# Patient Record
Sex: Female | Born: 1950 | Race: White | Hispanic: No | Smoking: Former smoker
Health system: Southern US, Community
[De-identification: ages and names within clinical notes are randomized; demographics above are authoritative.]

## PROBLEM LIST (undated history)

## (undated) DIAGNOSIS — I509 Heart failure, unspecified: Secondary | ICD-10-CM

## (undated) DIAGNOSIS — D649 Anemia, unspecified: Secondary | ICD-10-CM

## (undated) DIAGNOSIS — I2510 Atherosclerotic heart disease of native coronary artery without angina pectoris: Secondary | ICD-10-CM

## (undated) DIAGNOSIS — I639 Cerebral infarction, unspecified: Secondary | ICD-10-CM

## (undated) DIAGNOSIS — I10 Essential (primary) hypertension: Secondary | ICD-10-CM

## (undated) DIAGNOSIS — N186 End stage renal disease: Secondary | ICD-10-CM

## (undated) DIAGNOSIS — J449 Chronic obstructive pulmonary disease, unspecified: Secondary | ICD-10-CM

## (undated) DIAGNOSIS — N289 Disorder of kidney and ureter, unspecified: Secondary | ICD-10-CM

## (undated) DIAGNOSIS — Z992 Dependence on renal dialysis: Secondary | ICD-10-CM

## (undated) DIAGNOSIS — J189 Pneumonia, unspecified organism: Secondary | ICD-10-CM

## (undated) DIAGNOSIS — I219 Acute myocardial infarction, unspecified: Secondary | ICD-10-CM

## (undated) DIAGNOSIS — H547 Unspecified visual loss: Secondary | ICD-10-CM

## (undated) DIAGNOSIS — M1990 Unspecified osteoarthritis, unspecified site: Secondary | ICD-10-CM

---

## 1985-06-24 HISTORY — PX: TUBAL LIGATION: SHX77

## 2015-06-25 HISTORY — PX: INSERTION OF DIALYSIS CATHETER: SHX1324

## 2015-07-03 DIAGNOSIS — N250 Renal osteodystrophy: Secondary | ICD-10-CM | POA: Diagnosis present

## 2016-03-14 ENCOUNTER — Other Ambulatory Visit: Payer: Self-pay | Admitting: Surgery

## 2016-03-14 DIAGNOSIS — N186 End stage renal disease: Secondary | ICD-10-CM

## 2016-03-14 DIAGNOSIS — Z01810 Encounter for preprocedural cardiovascular examination: Secondary | ICD-10-CM

## 2016-04-26 ENCOUNTER — Encounter: Payer: Self-pay | Admitting: Surgery

## 2016-04-29 ENCOUNTER — Inpatient Hospital Stay (HOSPITAL_COMMUNITY): Admission: RE | Admit: 2016-04-29 | Payer: Self-pay | Source: Ambulatory Visit

## 2016-04-29 ENCOUNTER — Ambulatory Visit: Payer: Self-pay | Admitting: Surgery

## 2016-04-29 ENCOUNTER — Encounter (HOSPITAL_COMMUNITY): Payer: Self-pay

## 2016-05-02 ENCOUNTER — Encounter (HOSPITAL_COMMUNITY): Payer: Self-pay | Admitting: Emergency Medicine

## 2016-05-02 ENCOUNTER — Inpatient Hospital Stay (HOSPITAL_COMMUNITY)
Admission: EM | Admit: 2016-05-02 | Discharge: 2016-05-06 | DRG: 190 | Disposition: A | Discharge status: Home / Self Care | Payer: Medicare Other | Attending: Internal Medicine | Admitting: Internal Medicine

## 2016-05-02 ENCOUNTER — Emergency Department (HOSPITAL_COMMUNITY): Payer: Medicare Other

## 2016-05-02 DIAGNOSIS — N186 End stage renal disease: Secondary | ICD-10-CM | POA: Diagnosis present

## 2016-05-02 DIAGNOSIS — Z992 Dependence on renal dialysis: Secondary | ICD-10-CM

## 2016-05-02 DIAGNOSIS — Z79899 Other long term (current) drug therapy: Secondary | ICD-10-CM

## 2016-05-02 DIAGNOSIS — I252 Old myocardial infarction: Secondary | ICD-10-CM

## 2016-05-02 DIAGNOSIS — J449 Chronic obstructive pulmonary disease, unspecified: Secondary | ICD-10-CM | POA: Diagnosis present

## 2016-05-02 DIAGNOSIS — Z87891 Personal history of nicotine dependence: Secondary | ICD-10-CM

## 2016-05-02 DIAGNOSIS — J441 Chronic obstructive pulmonary disease with (acute) exacerbation: Principal | ICD-10-CM | POA: Diagnosis present

## 2016-05-02 DIAGNOSIS — N189 Chronic kidney disease, unspecified: Secondary | ICD-10-CM

## 2016-05-02 DIAGNOSIS — I2510 Atherosclerotic heart disease of native coronary artery without angina pectoris: Secondary | ICD-10-CM | POA: Diagnosis present

## 2016-05-02 DIAGNOSIS — N2581 Secondary hyperparathyroidism of renal origin: Secondary | ICD-10-CM | POA: Diagnosis present

## 2016-05-02 DIAGNOSIS — I5032 Chronic diastolic (congestive) heart failure: Secondary | ICD-10-CM | POA: Diagnosis present

## 2016-05-02 DIAGNOSIS — B348 Other viral infections of unspecified site: Secondary | ICD-10-CM | POA: Diagnosis present

## 2016-05-02 DIAGNOSIS — I10 Essential (primary) hypertension: Secondary | ICD-10-CM | POA: Diagnosis present

## 2016-05-02 DIAGNOSIS — R079 Chest pain, unspecified: Secondary | ICD-10-CM | POA: Diagnosis present

## 2016-05-02 DIAGNOSIS — I132 Hypertensive heart and chronic kidney disease with heart failure and with stage 5 chronic kidney disease, or end stage renal disease: Secondary | ICD-10-CM | POA: Diagnosis present

## 2016-05-02 DIAGNOSIS — D631 Anemia in chronic kidney disease: Secondary | ICD-10-CM | POA: Diagnosis present

## 2016-05-02 HISTORY — DX: Heart failure, unspecified: I50.9

## 2016-05-02 HISTORY — DX: Dependence on renal dialysis: Z99.2

## 2016-05-02 HISTORY — DX: Chronic obstructive pulmonary disease, unspecified: J44.9

## 2016-05-02 HISTORY — DX: End stage renal disease: N18.6

## 2016-05-02 HISTORY — DX: Atherosclerotic heart disease of native coronary artery without angina pectoris: I25.10

## 2016-05-02 HISTORY — DX: Pneumonia, unspecified organism: J18.9

## 2016-05-02 HISTORY — DX: Essential (primary) hypertension: I10

## 2016-05-02 LAB — CBC
HCT: 35.5 % — ABNORMAL LOW (ref 36.0–46.0)
Hemoglobin: 11.3 g/dL — ABNORMAL LOW (ref 12.0–15.0)
MCH: 31.7 pg (ref 26.0–34.0)
MCHC: 31.8 g/dL (ref 30.0–36.0)
MCV: 99.7 fL (ref 78.0–100.0)
PLATELETS: 221 10*3/uL (ref 150–400)
RBC: 3.56 MIL/uL — AB (ref 3.87–5.11)
RDW: 14.6 % (ref 11.5–15.5)
WBC: 11.6 10*3/uL — ABNORMAL HIGH (ref 4.0–10.5)

## 2016-05-02 LAB — BASIC METABOLIC PANEL
Anion gap: 10 (ref 5–15)
BUN: 21 mg/dL — AB (ref 6–20)
CALCIUM: 8.7 mg/dL — AB (ref 8.9–10.3)
CO2: 31 mmol/L (ref 22–32)
CREATININE: 3.69 mg/dL — AB (ref 0.44–1.00)
Chloride: 100 mmol/L — ABNORMAL LOW (ref 101–111)
GFR calc non Af Amer: 12 mL/min — ABNORMAL LOW (ref >60)
GFR, EST AFRICAN AMERICAN: 14 mL/min — AB (ref >60)
GLUCOSE: 106 mg/dL — AB (ref 65–99)
Potassium: 3.6 mmol/L (ref 3.5–5.1)
Sodium: 141 mmol/L (ref 135–145)

## 2016-05-02 LAB — I-STAT TROPONIN, ED: TROPONIN I, POC: 0.01 ng/mL (ref 0.00–0.08)

## 2016-05-02 MED ORDER — METHYLPREDNISOLONE SODIUM SUCC 125 MG IJ SOLR
125.0000 mg | Freq: Once | INTRAMUSCULAR | Status: DC
  Filled 2016-05-02: qty 2

## 2016-05-02 MED ORDER — ALBUTEROL SULFATE (2.5 MG/3ML) 0.083% IN NEBU
5.0000 mg | INHALATION_SOLUTION | Freq: Once | RESPIRATORY_TRACT | Status: AC
  Administered 2016-05-03: 5 mg via RESPIRATORY_TRACT
  Filled 2016-05-02: qty 6

## 2016-05-02 MED ORDER — ALBUTEROL SULFATE (2.5 MG/3ML) 0.083% IN NEBU
5.0000 mg | INHALATION_SOLUTION | Freq: Once | RESPIRATORY_TRACT | Status: AC
  Administered 2016-05-02: 5 mg via RESPIRATORY_TRACT

## 2016-05-02 MED ORDER — ALBUTEROL SULFATE (2.5 MG/3ML) 0.083% IN NEBU
INHALATION_SOLUTION | RESPIRATORY_TRACT | Status: AC
  Filled 2016-05-02: qty 6

## 2016-05-02 NOTE — ED Provider Notes (Signed)
[facility]-EMERGENCY DEPT Provider Note   CSN: [id number] Arrival date & time: 05/02/16  2036   By signing my name below, I, [name], attest that this documentation has been prepared under the direction and in the presence of [name], MD . Electronically Signed: [name], Scribe. 05/02/2016. 11:54 PM.   History   Chief Complaint Chief Complaint  Patient presents with  . Chest Pain   The history is provided by the patient. No language interpreter was used.    HPI Comments:  Nancy Macdonald is a 65 y.o. female with a history of CHF, COPD, ESRD, PNA  and CAD who presents to the Emergency Department complaining of moderate substernal CP x a few days. She reports associated SOB and dry cough. Her SOB is worse when supine. No alleviating factors noted. Pt receives dialysis on Tu/Th/Sat; she was last dialyzed this AM. Daughter notes she is often short of breath after dialysis.   Past Medical History:  Diagnosis Date  . CHF (congestive heart failure) (HCC)   . COPD (chronic obstructive pulmonary disease) (HCC)   . Coronary artery disease   . ESRD (end stage renal disease) on dialysis (HCC)   . Hypertension   . Pneumonia     Patient Active Problem List   Diagnosis Date Noted  . COPD with exacerbation (HCC) 05/03/2016  . Chronic diastolic CHF (congestive heart failure) (HCC) 05/03/2016  . ESRD (end stage renal disease) on dialysis (HCC) 05/03/2016  . Essential hypertension 05/03/2016  . Anemia of chronic renal failure, stage 5 (HCC) 05/03/2016    History reviewed. No pertinent surgical history.  OB History    No data available       Home Medications    Prior to Admission medications   Medication Sig Start Date End Date Taking? Authorizing Provider  albuterol (PROVENTIL HFA;VENTOLIN HFA) 108 (90 Base) MCG/ACT inhaler Inhale 1-2 puffs into the lungs every 6 (six) hours as needed for wheezing or shortness of breath.   Yes Historical Provider, MD  amLODipine  (NORVASC) 5 MG tablet Take 5 mg by mouth See admin instructions. On Tuesday Thursday and Saturday only take once a day, all other days take twice daily.   Yes Historical Provider, MD  carvedilol (COREG) 25 MG tablet Take 25 mg by mouth See admin instructions. On Tuesday Thursday and Saturday only take once a day, all other days take twice daily.   Yes Historical Provider, MD  sevelamer carbonate (RENVELA) 800 MG tablet Take 800 mg by mouth 3 (three) times daily with meals.   Yes Historical Provider, MD    Family History Family History  Problem Relation Age of Onset  . Heart attack Mother     Social History Social History  Substance Use Topics  . Smoking status: Former Games developermoker  . Smokeless tobacco: Never Used  . Alcohol use No     Allergies   Patient has no known allergies.   Review of Systems Review of Systems  Constitutional: Negative for chills and fever.  HENT: Negative for congestion.   Respiratory: Positive for cough, shortness of breath and wheezing.   Cardiovascular: Positive for chest pain. Negative for palpitations and leg swelling.  Gastrointestinal: Negative for abdominal pain, constipation, diarrhea, nausea and vomiting.  Musculoskeletal: Negative for myalgias and neck pain.  Skin: Negative for rash and wound.  Neurological: Negative for dizziness, weakness, numbness and headaches.  All other systems reviewed and are negative.    Physical Exam Updated Vital Signs BP 166/100   Pulse  78   Temp 98.2 F (36.8 C) (Oral)   Resp 24   SpO2 (!) 88%   Physical Exam  Constitutional: She is oriented to person, place, and time. She appears well-developed and well-nourished. No distress.  HENT:  Head: Normocephalic and atraumatic.  Mouth/Throat: Oropharynx is clear and moist.  Eyes: EOM are normal. Pupils are equal, round, and reactive to light.  Neck: Normal range of motion. Neck supple. No JVD present.  Cardiovascular: Normal rate and regular rhythm.  Exam reveals  no gallop and no friction rub.   No murmur heard. Pulmonary/Chest: Effort normal. She has wheezes.  Expiratory wheezing throughout with scattered rhonchi.  Abdominal: Soft. Bowel sounds are normal. There is no tenderness. There is no rebound and no guarding.  Musculoskeletal: Normal range of motion. She exhibits no edema or tenderness.  No lower extremity swelling, asymmetry or tenderness.  Neurological: She is alert and oriented to person, place, and time.  Moving all extremities without deficit. Sensation is fully intact.  Skin: Skin is warm and dry. Capillary refill takes less than 2 seconds. No rash noted. No erythema.  Psychiatric: She has a normal mood and affect. Her behavior is normal.  Nursing note and vitals reviewed.    ED Treatments / Results  DIAGNOSTIC STUDIES:  Oxygen Saturation is 97% on RA, adequate by my interpretation.    COORDINATION OF CARE:  11:49 PM Discussed treatment plan with pt at bedside and pt agreed to plan.  Labs (all labs ordered are listed, but only abnormal results are displayed) Labs Reviewed  BASIC METABOLIC PANEL - Abnormal; Notable for the following:       Result Value   Chloride 100 (*)    Glucose, Bld 106 (*)    BUN 21 (*)    Creatinine, Ser 3.69 (*)    Calcium 8.7 (*)    GFR calc non Af Amer 12 (*)    GFR calc Af Amer 14 (*)    All other components within normal limits  CBC - Abnormal; Notable for the following:    WBC 11.6 (*)    RBC 3.56 (*)    Hemoglobin 11.3 (*)    HCT 35.5 (*)    All other components within normal limits  BRAIN NATRIURETIC PEPTIDE - Abnormal; Notable for the following:    B Natriuretic Peptide 550.8 (*)    All other components within normal limits  RESPIRATORY PANEL BY PCR  I-STAT TROPOININ, ED  I-STAT TROPOININ, ED    EKG  EKG Interpretation  Date/Time:  Thursday May 02 2016 20:40:01 EST Ventricular Rate:  84 PR Interval:  148 QRS Duration: 90 QT Interval:  422 QTC Calculation: 498 R  Axis:   27 Text Interpretation:  Normal sinus rhythm ST & T wave abnormality, consider lateral ischemia Abnormal ECG Confirmed by Ranae Palms  MD, Huck Ashworth ([id number]) on 05/02/2016 11:41:36 PM       Radiology Dg Chest 2 View  Result Date: 05/02/2016 CLINICAL DATA:  Pt c/o generalized chest pain and productive cough x 2 days. Hx of CHF, CAD, COPD, ESRD, HTN, AND PNA. Pt is a current smoker. EXAM: CHEST  2 VIEW COMPARISON:  None. FINDINGS: Right-sided dual-lumen central venous line tip overlies the level of the lower superior vena cava. Lungs are hyperinflated. Heart size is normal. There are bilateral pleural effusions. Bibasilar opacities are consistent with infiltrates and/or atelectasis. IMPRESSION: 1. Hyperinflated lungs. 2. Bibasilar atelectasis or infiltrates and bilateral pleural effusions. Electronically Signed   By: Norva Pavlov  M.D.   On: 05/02/2016 21:39    Procedures Procedures (including critical care time)  Medications Ordered in ED Medications  albuterol (PROVENTIL) (2.5 MG/3ML) 0.083% nebulizer solution 5 mg (5 mg Nebulization Given 05/02/16 2048)  albuterol (PROVENTIL) (2.5 MG/3ML) 0.083% nebulizer solution 5 mg (5 mg Nebulization Given 05/03/16 0010)  methylPREDNISolone sodium succinate (SOLU-MEDROL) 125 mg/2 mL injection 125 mg (125 mg Intramuscular Given 05/03/16 0010)     Initial Impression / Assessment and Plan / ED Course  I have reviewed the triage vital signs and the nursing notes.  Pertinent labs & imaging results that were available during my care of the patient were reviewed by me and considered in my medical decision making (see chart for details).  Clinical Course    Patient with desaturations into the 80s with mild exertion. She's had slight Medrol and albuterol nebs 2. States her shorts of breath is improved the wheezing still present. Bibasilar infiltrates versus atelectasis. Patient does not have productive cough and denies fever or chills. Have a low  suspicion this is pneumonia. Discussed with hospitalist and will see patient in the emergency department and admit.   Final Clinical Impressions(s) / ED Diagnoses   Final diagnoses:  COPD exacerbation (HCC)    New Prescriptions New Prescriptions   No medications on file   I personally performed the services described in this documentation, which was scribed in my presence. The recorded information has been reviewed and is accurate.       Loren Raceravid Melvin Whiteford, MD 05/03/16 [phone]

## 2016-05-02 NOTE — ED Notes (Signed)
EDP at bedside  

## 2016-05-02 NOTE — ED Triage Notes (Signed)
Patient reports central chest pain with SOB ( COPD/CHF)  and dry cough for several days , denies emesis or diaphoresis , hemodialysis q Tues/Thurs/Sat , denies fever or chills .

## 2016-05-03 ENCOUNTER — Encounter (HOSPITAL_COMMUNITY): Payer: Self-pay | Admitting: Family Medicine

## 2016-05-03 DIAGNOSIS — N185 Chronic kidney disease, stage 5: Secondary | ICD-10-CM | POA: Diagnosis not present

## 2016-05-03 DIAGNOSIS — J449 Chronic obstructive pulmonary disease, unspecified: Secondary | ICD-10-CM | POA: Diagnosis present

## 2016-05-03 DIAGNOSIS — I10 Essential (primary) hypertension: Secondary | ICD-10-CM | POA: Diagnosis present

## 2016-05-03 DIAGNOSIS — N2581 Secondary hyperparathyroidism of renal origin: Secondary | ICD-10-CM | POA: Diagnosis present

## 2016-05-03 DIAGNOSIS — I509 Heart failure, unspecified: Secondary | ICD-10-CM | POA: Diagnosis not present

## 2016-05-03 DIAGNOSIS — I252 Old myocardial infarction: Secondary | ICD-10-CM | POA: Diagnosis not present

## 2016-05-03 DIAGNOSIS — D631 Anemia in chronic kidney disease: Secondary | ICD-10-CM | POA: Diagnosis present

## 2016-05-03 DIAGNOSIS — Z87891 Personal history of nicotine dependence: Secondary | ICD-10-CM | POA: Diagnosis not present

## 2016-05-03 DIAGNOSIS — Z992 Dependence on renal dialysis: Secondary | ICD-10-CM

## 2016-05-03 DIAGNOSIS — I2510 Atherosclerotic heart disease of native coronary artery without angina pectoris: Secondary | ICD-10-CM | POA: Diagnosis present

## 2016-05-03 DIAGNOSIS — N186 End stage renal disease: Secondary | ICD-10-CM | POA: Diagnosis present

## 2016-05-03 DIAGNOSIS — Z79899 Other long term (current) drug therapy: Secondary | ICD-10-CM | POA: Diagnosis not present

## 2016-05-03 DIAGNOSIS — B348 Other viral infections of unspecified site: Secondary | ICD-10-CM | POA: Diagnosis present

## 2016-05-03 DIAGNOSIS — I5032 Chronic diastolic (congestive) heart failure: Secondary | ICD-10-CM | POA: Diagnosis present

## 2016-05-03 DIAGNOSIS — J441 Chronic obstructive pulmonary disease with (acute) exacerbation: Secondary | ICD-10-CM | POA: Diagnosis present

## 2016-05-03 DIAGNOSIS — I132 Hypertensive heart and chronic kidney disease with heart failure and with stage 5 chronic kidney disease, or end stage renal disease: Secondary | ICD-10-CM | POA: Diagnosis present

## 2016-05-03 DIAGNOSIS — N189 Chronic kidney disease, unspecified: Secondary | ICD-10-CM

## 2016-05-03 DIAGNOSIS — R079 Chest pain, unspecified: Secondary | ICD-10-CM | POA: Diagnosis present

## 2016-05-03 LAB — RESPIRATORY PANEL BY PCR
Adenovirus: NOT DETECTED
Bordetella pertussis: NOT DETECTED
CORONAVIRUS OC43-RVPPCR: NOT DETECTED
Chlamydophila pneumoniae: NOT DETECTED
Coronavirus 229E: NOT DETECTED
Coronavirus HKU1: NOT DETECTED
Coronavirus NL63: NOT DETECTED
INFLUENZA A-RVPPCR: NOT DETECTED
INFLUENZA B-RVPPCR: NOT DETECTED
METAPNEUMOVIRUS-RVPPCR: NOT DETECTED
Mycoplasma pneumoniae: NOT DETECTED
PARAINFLUENZA VIRUS 1-RVPPCR: NOT DETECTED
PARAINFLUENZA VIRUS 2-RVPPCR: NOT DETECTED
PARAINFLUENZA VIRUS 4-RVPPCR: NOT DETECTED
Parainfluenza Virus 3: NOT DETECTED
RESPIRATORY SYNCYTIAL VIRUS-RVPPCR: NOT DETECTED
Rhinovirus / Enterovirus: DETECTED — AB

## 2016-05-03 LAB — CBC
HEMATOCRIT: 35.2 % — AB (ref 36.0–46.0)
HEMOGLOBIN: 11.2 g/dL — AB (ref 12.0–15.0)
MCH: 31.5 pg (ref 26.0–34.0)
MCHC: 31.8 g/dL (ref 30.0–36.0)
MCV: 99.2 fL (ref 78.0–100.0)
Platelets: 201 10*3/uL (ref 150–400)
RBC: 3.55 MIL/uL — ABNORMAL LOW (ref 3.87–5.11)
RDW: 14.5 % (ref 11.5–15.5)
WBC: 9.5 10*3/uL (ref 4.0–10.5)

## 2016-05-03 LAB — I-STAT TROPONIN, ED: Troponin i, poc: 0 ng/mL (ref 0.00–0.08)

## 2016-05-03 LAB — BASIC METABOLIC PANEL
ANION GAP: 11 (ref 5–15)
BUN: 31 mg/dL — AB (ref 6–20)
CHLORIDE: 101 mmol/L (ref 101–111)
CO2: 29 mmol/L (ref 22–32)
Calcium: 8.7 mg/dL — ABNORMAL LOW (ref 8.9–10.3)
Creatinine, Ser: 4.41 mg/dL — ABNORMAL HIGH (ref 0.44–1.00)
GFR calc Af Amer: 11 mL/min — ABNORMAL LOW (ref >60)
GFR calc non Af Amer: 10 mL/min — ABNORMAL LOW (ref >60)
GLUCOSE: 145 mg/dL — AB (ref 65–99)
Potassium: 4.2 mmol/L (ref 3.5–5.1)
Sodium: 141 mmol/L (ref 135–145)

## 2016-05-03 LAB — PROCALCITONIN: Procalcitonin: 0.27 ng/mL

## 2016-05-03 LAB — BRAIN NATRIURETIC PEPTIDE: B NATRIURETIC PEPTIDE 5: 550.8 pg/mL — AB (ref 0.0–100.0)

## 2016-05-03 LAB — TROPONIN I: Troponin I: <0.03 ng/mL (ref <0.03)

## 2016-05-03 MED ORDER — CARVEDILOL 25 MG PO TABS: 25.0000 mg | ORAL_TABLET | Freq: Every day | ORAL | Status: DC

## 2016-05-03 MED ORDER — CARVEDILOL 25 MG PO TABS
25.0000 mg | ORAL_TABLET | ORAL | Status: DC
  Administered 2016-05-03 – 2016-05-06 (×4): 25 mg via ORAL
  Filled 2016-05-03 (×4): qty 1

## 2016-05-03 MED ORDER — ACETAMINOPHEN 325 MG PO TABS: 650.0000 mg | ORAL_TABLET | Freq: Four times a day (QID) | ORAL | Status: DC | PRN

## 2016-05-03 MED ORDER — ENOXAPARIN SODIUM 30 MG/0.3ML ~~LOC~~ SOLN
30.0000 mg | Freq: Every day | SUBCUTANEOUS | Status: DC
  Administered 2016-05-03 – 2016-05-06 (×4): 30 mg via SUBCUTANEOUS
  Filled 2016-05-03 (×4): qty 0.3

## 2016-05-03 MED ORDER — METHYLPREDNISOLONE SODIUM SUCC 125 MG IJ SOLR
125.0000 mg | Freq: Once | INTRAMUSCULAR | Status: AC
  Administered 2016-05-03: 125 mg via INTRAMUSCULAR

## 2016-05-03 MED ORDER — AMLODIPINE BESYLATE 5 MG PO TABS: 5.0000 mg | ORAL_TABLET | ORAL | Status: DC

## 2016-05-03 MED ORDER — ALBUTEROL SULFATE (2.5 MG/3ML) 0.083% IN NEBU: 2.5000 mg | INHALATION_SOLUTION | Freq: Four times a day (QID) | RESPIRATORY_TRACT | Status: DC

## 2016-05-03 MED ORDER — IPRATROPIUM-ALBUTEROL 0.5-2.5 (3) MG/3ML IN SOLN
3.0000 mL | Freq: Four times a day (QID) | RESPIRATORY_TRACT | Status: DC
  Administered 2016-05-03 (×2): 3 mL via RESPIRATORY_TRACT
  Filled 2016-05-03 (×2): qty 3

## 2016-05-03 MED ORDER — GUAIFENESIN ER 600 MG PO TB12
600.0000 mg | ORAL_TABLET | Freq: Two times a day (BID) | ORAL | Status: DC
  Administered 2016-05-03 – 2016-05-06 (×7): 600 mg via ORAL
  Filled 2016-05-03 (×7): qty 1

## 2016-05-03 MED ORDER — CARVEDILOL 25 MG PO TABS: 25.0000 mg | ORAL_TABLET | ORAL | Status: DC

## 2016-05-03 MED ORDER — AMLODIPINE BESYLATE 5 MG PO TABS
5.0000 mg | ORAL_TABLET | Freq: Every day | ORAL | Status: DC
  Administered 2016-05-03 – 2016-05-05 (×3): 5 mg via ORAL
  Filled 2016-05-03 (×3): qty 1

## 2016-05-03 MED ORDER — SEVELAMER CARBONATE 800 MG PO TABS
800.0000 mg | ORAL_TABLET | Freq: Three times a day (TID) | ORAL | Status: DC
  Administered 2016-05-03 – 2016-05-06 (×10): 800 mg via ORAL
  Filled 2016-05-03 (×10): qty 1

## 2016-05-03 MED ORDER — CARVEDILOL 25 MG PO TABS
25.0000 mg | ORAL_TABLET | ORAL | Status: DC
  Administered 2016-05-03: 25 mg via ORAL
  Filled 2016-05-03: qty 1

## 2016-05-03 MED ORDER — ALBUTEROL SULFATE (2.5 MG/3ML) 0.083% IN NEBU: 2.5000 mg | INHALATION_SOLUTION | RESPIRATORY_TRACT | Status: DC | PRN

## 2016-05-03 MED ORDER — HYDRALAZINE HCL 20 MG/ML IJ SOLN
5.0000 mg | INTRAMUSCULAR | Status: DC | PRN
  Administered 2016-05-03 (×2): 5 mg via INTRAVENOUS
  Filled 2016-05-03 (×2): qty 1

## 2016-05-03 MED ORDER — IPRATROPIUM-ALBUTEROL 0.5-2.5 (3) MG/3ML IN SOLN
3.0000 mL | Freq: Three times a day (TID) | RESPIRATORY_TRACT | Status: DC
  Administered 2016-05-03 – 2016-05-05 (×4): 3 mL via RESPIRATORY_TRACT
  Filled 2016-05-03 (×4): qty 3

## 2016-05-03 MED ORDER — GUAIFENESIN-DM 100-10 MG/5ML PO SYRP: 5.0000 mL | ORAL_SOLUTION | ORAL | Status: DC | PRN

## 2016-05-03 MED ORDER — AMLODIPINE BESYLATE 5 MG PO TABS
5.0000 mg | ORAL_TABLET | ORAL | Status: DC
  Administered 2016-05-03 – 2016-05-06 (×3): 5 mg via ORAL
  Filled 2016-05-03 (×3): qty 1

## 2016-05-03 MED ORDER — CARVEDILOL 25 MG PO TABS
25.0000 mg | ORAL_TABLET | ORAL | Status: DC
  Administered 2016-05-04: 25 mg via ORAL

## 2016-05-03 MED ORDER — ACETAMINOPHEN 650 MG RE SUPP: 650.0000 mg | Freq: Four times a day (QID) | RECTAL | Status: DC | PRN

## 2016-05-03 MED ORDER — PREDNISONE 50 MG PO TABS
50.0000 mg | ORAL_TABLET | Freq: Every day | ORAL | Status: DC
  Administered 2016-05-03 – 2016-05-05 (×3): 50 mg via ORAL
  Filled 2016-05-03 (×3): qty 1

## 2016-05-03 NOTE — Progress Notes (Signed)
PROGRESS NOTE  Nancy HittCarrie Macdonald  ONG:[id number] DOB: Jun 29, 1950 DOA: 05/02/2016 PCP: No primary care provider on file. Outpatient Specialists:  Subjective: Feels okay, denies any new complaints, cough with minimal sputum production  Brief Narrative:  Nancy Macdonald is a 65 y.o. female with a past medical history significant for COPD, CHF unknown type, and ESRD on HD TThS who presents with 5 days worsening dyspnea and cough.  Patient was in her usual state of health until last Saturday when she felt like she was "catching a cold". Since then her symptoms getting progressively worse with cough, nonproductive cough, wheezing, shortness of breath with exertion, and dyspnea. Tuesday after dialysis, her daughter told her that her "lips were blue". Then today she had rib and sternal pain after coughing for a long time, should she came to the emergency room.  She has had no fever, sputum, chills. She has had no change in her chronic orthopnea, no leg swelling, no paroxysmal nocturnal dyspnea.  Assessment & Plan:   Principal Problem:   COPD with exacerbation (HCC) Active Problems:   Chronic diastolic CHF (congestive heart failure) (HCC)   ESRD (end stage renal disease) on dialysis (HCC)   Essential hypertension   Anemia of chronic renal failure, stage 5 (HCC)   COPD exacerbation (HCC)   Patient seen earlier today by my colleague Dr. Maryfrances Bunnellanford. This is a no charge note. Patient admitted with acute COPD exacerbation, has history of ESRD.   DVT prophylaxis: Subcutaneous heparin Code Status: Full Code Family Communication:  Disposition Plan:  Diet: Diet Heart Room service appropriate? Yes; Fluid consistency: Thin  Consultants:   Nephrology  Procedures:   None  Antimicrobials:   Rocephin and azithromycin  Objective: Vitals:   05/03/16 0200 05/03/16 0230 05/03/16 0400 05/03/16 0931  BP: 166/100 151/97 (!) 170/97 (!) 171/94  Pulse: 78 79 78 86  Resp: 24 21 20 18   Temp:   97.3 F  (36.3 C) 97.7 F (36.5 C)  TempSrc:   Oral Oral  SpO2: (!) 88% 92% 97% 98%  Weight:   52.2 kg (115 lb)   Height:   5\' 4"  (1.626 m)     Intake/Output Summary (Last 24 hours) at 05/03/16 1120 Last data filed at 05/03/16 0900  Gross per 24 hour  Intake              240 ml  Output              100 ml  Net              140 ml   Filed Weights   05/03/16 0400  Weight: 52.2 kg (115 lb)    Examination: General exam: Appears calm and comfortable  Respiratory system: Clear to auscultation. Respiratory effort normal. Cardiovascular system: S1 & S2 heard, RRR. No JVD, murmurs, rubs, gallops or clicks. No pedal edema. Gastrointestinal system: Abdomen is nondistended, soft and nontender. No organomegaly or masses felt. Normal bowel sounds heard. Central nervous system: Alert and oriented. No focal neurological deficits. Extremities: Symmetric 5 x 5 power. Skin: No rashes, lesions or ulcers Psychiatry: Judgement and insight appear normal. Mood & affect appropriate.   Data Reviewed: I have personally reviewed following labs and imaging studies  CBC:  Recent Labs Lab 05/02/16 2047 05/03/16 0532  WBC 11.6* 9.5  HGB 11.3* 11.2*  HCT 35.5* 35.2*  MCV 99.7 99.2  PLT 221 201   Basic Metabolic Panel:  Recent Labs Lab 05/02/16 2047 05/03/16 0532  NA 141 141  K 3.6 4.2  CL 100* 101  CO2 31 29  GLUCOSE 106* 145*  BUN 21* 31*  CREATININE 3.69* 4.41*  CALCIUM 8.7* 8.7*   GFR: Estimated Creatinine Clearance: 10.8 mL/min (by C-G formula based on SCr of 4.41 mg/dL (H)). Liver Function Tests: No results for input(s): AST, ALT, ALKPHOS, BILITOT, PROT, ALBUMIN in the last 168 hours. No results for input(s): LIPASE, AMYLASE in the last 168 hours. No results for input(s): AMMONIA in the last 168 hours. Coagulation Profile: No results for input(s): INR, PROTIME in the last 168 hours. Cardiac Enzymes:  Recent Labs Lab 05/03/16 0532  TROPONINI <0.03   BNP (last 3 results) No  results for input(s): PROBNP in the last 8760 hours. HbA1C: No results for input(s): HGBA1C in the last 72 hours. CBG: No results for input(s): GLUCAP in the last 168 hours. Lipid Profile: No results for input(s): CHOL, HDL, LDLCALC, TRIG, CHOLHDL, LDLDIRECT in the last 72 hours. Thyroid Function Tests: No results for input(s): TSH, T4TOTAL, FREET4, T3FREE, THYROIDAB in the last 72 hours. Anemia Panel: No results for input(s): VITAMINB12, FOLATE, FERRITIN, TIBC, IRON, RETICCTPCT in the last 72 hours. Urine analysis: No results found for: COLORURINE, APPEARANCEUR, LABSPEC, PHURINE, GLUCOSEU, HGBUR, BILIRUBINUR, KETONESUR, PROTEINUR, UROBILINOGEN, NITRITE, LEUKOCYTESUR Sepsis Labs: @LABRCNTIP (procalcitonin:4,lacticidven:4)  )No results found for this or any previous visit (from the past 240 hour(s)).   Invalid input(s): PROCALCITONIN, LACTICACIDVEN   Radiology Studies: Dg Chest 2 View  Result Date: 05/02/2016 CLINICAL DATA:  Pt c/o generalized chest pain and productive cough x 2 days. Hx of CHF, CAD, COPD, ESRD, HTN, AND PNA. Pt is a current smoker. EXAM: CHEST  2 VIEW COMPARISON:  None. FINDINGS: Right-sided dual-lumen central venous line tip overlies the level of the lower superior vena cava. Lungs are hyperinflated. Heart size is normal. There are bilateral pleural effusions. Bibasilar opacities are consistent with infiltrates and/or atelectasis. IMPRESSION: 1. Hyperinflated lungs. 2. Bibasilar atelectasis or infiltrates and bilateral pleural effusions. Electronically Signed   By: Norva PavlovElizabeth  Brown M.D.   On: 05/02/2016 21:39        Scheduled Meds: . amLODipine  5 mg Oral Once per day on Sun Mon Wed Fri   And  . amLODipine  5 mg Oral QHS  . [START ON 05/04/2016] carvedilol  25 mg Oral Once per day on Tue Thu Sat   And  . carvedilol  25 mg Oral 2 times per day on Sun Mon Wed Fri  . enoxaparin (LOVENOX) injection  30 mg Subcutaneous Daily  . ipratropium-albuterol  3 mL  Nebulization QID  . predniSONE  50 mg Oral Q breakfast  . sevelamer carbonate  800 mg Oral TID WC   Continuous Infusions:   LOS: 0 days    Time spent: 35 minutes    Shanautica Forker A, MD [facility] Pager [phone]  If 7PM-7AM, please contact night-coverage [email or web address] Password [email or web address] 05/03/2016, 11:20 AM

## 2016-05-03 NOTE — Plan of Care (Signed)
Problem: Education: Goal: Knowledge of [facility] Education information/materials will improve Outcome: Progressing POC reviewed with pt.   

## 2016-05-03 NOTE — Progress Notes (Signed)
Pt. admitted from ER via stretcher; alert and oriented x4; placed on droplet isolation. Pt. Oriented to room and hospital policies. Pt. swabbed for respiratory panel pcr and sent to lab; BP elevated in ER and presently and Craige CottaKirby, NP paged and notified.

## 2016-05-03 NOTE — H&P (Signed)
History and Physical  Patient Name: Nancy Macdonald     ZOX:[id number]    DOB: 1951/04/19    DOA: 05/02/2016 PCP: No primary care provider on file.   Patient coming from: Home  Chief Complaint: Dyspnea and rib pain  HPI: Nancy Macdonald is a 65 y.o. female with a past medical history significant for COPD, CHF unknown type, and ESRD on HD TThS who presents with 5 days worsening dyspnea and cough.  Patient was in her usual state of health until last Saturday when she felt like she was "catching a cold". Since then her symptoms getting progressively worse with cough, nonproductive cough, wheezing, shortness of breath with exertion, and dyspnea. Tuesday after dialysis, her daughter told her that her "lips were blue". Then today she had rib and sternal pain after coughing for a long time, should she came to the emergency room.  She has had no fever, sputum, chills. She has had no change in her chronic orthopnea, no leg swelling, no paroxysmal nocturnal dyspnea.  ED course: -Afebrile, heart rate 80s, respirations 20, pulse oximetry low 90s on room air, blood pressure 149/93 -Wheezy on exam -Na 141, K 3.6, Cr 3.69, WBC 11.6K, Hgb 11.3 -BNP 550, troponin negative 2 -Chest x-ray showed no edema, no focal pneumonia, small bilateral effusions -She was given IV Solu-Medrol, and albuterol which improved her symptoms -Her O2 sat dropped to low 80s with ambulation and so TRH were asked to evaluate     ROS: Review of Systems  Constitutional: Positive for malaise/fatigue. Negative for chills and fever.  Respiratory: Positive for cough, shortness of breath and wheezing. Negative for hemoptysis and sputum production.   Cardiovascular: Positive for chest pain and orthopnea (chronic). Negative for palpitations, leg swelling and PND.  Gastrointestinal: Negative for abdominal pain, nausea and vomiting.  All other systems reviewed and are negative.         Past Medical History:  Diagnosis Date  . CHF  (congestive heart failure) (HCC)   . COPD (chronic obstructive pulmonary disease) (HCC)   . Coronary artery disease   . ESRD (end stage renal disease) on dialysis (HCC)   . Hypertension   . Pneumonia     History reviewed. No pertinent surgical history.  Social History: Patient and her daughter live with daughter's boyfriend's grandmother right now, while they search for their own place.  The patient walks unassisted.  She is from [place].  Worked in the [employer] for [employer].  Former smoker, quit in January of this year.  Minimal alcohol.    No Known Allergies  Family history: family history includes Heart attack in her mother.  Prior to Admission medications   Medication Sig Start Date End Date Taking? Authorizing Provider  albuterol (PROVENTIL HFA;VENTOLIN HFA) 108 (90 Base) MCG/ACT inhaler Inhale 1-2 puffs into the lungs every 6 (six) hours as needed for wheezing or shortness of breath.   Yes Historical Provider, MD  amLODipine (NORVASC) 5 MG tablet Take 5 mg by mouth See admin instructions. On Tuesday Thursday and Saturday only take once a day, all other days take twice daily.   Yes Historical Provider, MD  carvedilol (COREG) 25 MG tablet Take 25 mg by mouth See admin instructions. On Tuesday Thursday and Saturday only take once a day, all other days take twice daily.   Yes Historical Provider, MD  sevelamer carbonate (RENVELA) 800 MG tablet Take 800 mg by mouth 3 (three) times daily with meals.   Yes Historical Provider, MD  Physical Exam: BP 166/100   Pulse 78   Temp 98.2 F (36.8 C) (Oral)   Resp 24   SpO2 (!) 88%  General appearance: Thin, elderly adult female, alert and in no acute distress.   Eyes: Anicteric, conjunctiva pink, lids and lashes normal. PERRL.    ENT: No nasal deformity, discharge, epistaxis.  Hearing normal. OP moist without lesions.  Edentulous. Neck: No neck masses.  Trachea midline.  No thyromegaly/tenderness. Lymph: No cervical or  supraclavicular lymphadenopathy. Skin: Warm and dry.  No jaundice.  No suspicious rashes or lesions. Cardiac: RRR, nl S1-S2, no murmurs appreciated.  Capillary refill is brisk.  JVP normal.  No LE edema.  Radial pulses 2+ and symmetric.  DP pulses diminished. Respiratory: Normal respiratory rate and rhythm at this time.  Dimished at bases, low pitched wheezes diffusely.  Coarse air way sounds, rattling cough. Abdomen: Abdomen soft.  No TTP. No ascites, distension, hepatosplenomegaly.   MSK: No deformities or effusions.  No cyanosis or clubbing. Neuro: Cranial nerves normal.  Sensation intact to light touch. Speech is fluent.  Muscle strength normal.    Psych: Sensorium intact and responding to questions, attention normal.  Behavior appropriate.  Affect normal.  Judgment and insight appear normal.     Labs on Admission:  I have personally reviewed following labs and imaging studies: CBC:  Recent Labs Lab 05/02/16 2047  WBC 11.6*  HGB 11.3*  HCT 35.5*  MCV 99.7  PLT 221   Basic Metabolic Panel:  Recent Labs Lab 05/02/16 2047  NA 141  K 3.6  CL 100*  CO2 31  GLUCOSE 106*  BUN 21*  CREATININE 3.69*  CALCIUM 8.7*   GFR: CrCl cannot be calculated (Unknown ideal weight.).  Liver Function Tests: No results for input(s): AST, ALT, ALKPHOS, BILITOT, PROT, ALBUMIN in the last 168 hours. No results for input(s): LIPASE, AMYLASE in the last 168 hours. No results for input(s): AMMONIA in the last 168 hours. Coagulation Profile: No results for input(s): INR, PROTIME in the last 168 hours. Cardiac Enzymes: No results for input(s): CKTOTAL, CKMB, CKMBINDEX, TROPONINI in the last 168 hours. BNP (last 3 results) No results for input(s): PROBNP in the last 8760 hours. HbA1C: No results for input(s): HGBA1C in the last 72 hours. CBG: No results for input(s): GLUCAP in the last 168 hours. Lipid Profile: No results for input(s): CHOL, HDL, LDLCALC, TRIG, CHOLHDL, LDLDIRECT in the  last 72 hours. Thyroid Function Tests: No results for input(s): TSH, T4TOTAL, FREET4, T3FREE, THYROIDAB in the last 72 hours. Anemia Panel: No results for input(s): VITAMINB12, FOLATE, FERRITIN, TIBC, IRON, RETICCTPCT in the last 72 hours. Sepsis Labs: Invalid input(s): PROCALCITONIN, LACTICIDVEN No results found for this or any previous visit (from the past 240 hour(s)).       Radiological Exams on Admission: Personally reviewed CXR shows no edema or opacity: Dg Chest 2 View  Result Date: 05/02/2016 CLINICAL DATA:  Pt c/o generalized chest pain and productive cough x 2 days. Hx of CHF, CAD, COPD, ESRD, HTN, AND PNA. Pt is a current smoker. EXAM: CHEST  2 VIEW COMPARISON:  None. FINDINGS: Right-sided dual-lumen central venous line tip overlies the level of the lower superior vena cava. Lungs are hyperinflated. Heart size is normal. There are bilateral pleural effusions. Bibasilar opacities are consistent with infiltrates and/or atelectasis. IMPRESSION: 1. Hyperinflated lungs. 2. Bibasilar atelectasis or infiltrates and bilateral pleural effusions. Electronically Signed   By: Norva PavlovElizabeth  Brown M.D.   On: 05/02/2016 21:39  EKG: Independently reviewed. Rate 84, QTc 498, sinus rhythm, no ST changes.  Lateral TWI, no old for comparison.  [facility]ch[facility] January 2017: EF 60% Mild LVH Normal valves    Assessment/Plan  1. COPD with exacerbation:  -Prednisone 50 mg daily for 5 days -CM consult for help obtaining nebulizer -Albuterol scheduled and PRN -Check procalcitonin and add antibiotics if significantly elevated    2. Chronic diastolic CHF:  No significant hypervolemia. -Due for HD on Saturday -Follow I/Os, daily weights -Continue BP meds  3. ESRD on HD TThS:  -Continue Renvela -Consult to Nephrology on Saturday if needed for maintenance HD  4. Anemia of renal disease:  Stable  5. HTN:  -Continue amlodipine and carvedilol  6. Chest pain:  Doubt ACS. -Trend enzymes   -Repeat ECG in AM         DVT prophylaxis: Lovenox  Code Status: FULL  Family Communication: None present  Disposition Plan: Anticipate steroids and bronchodilators and monitor o2 saturation. Consults called: None Admission status: INPATIENT, med surg        Medical decision making: Patient seen at 2:42 AM on 05/03/2016.  The patient was discussed with Dr. Ranae PalmsYelverton.  What exists of the patient's chart was reviewed in depth and [facility] were reviewed and summarized above.  Clinical condition: stable.        Alberteen SamChristopher P Kaitlinn Iversen [facility] Pager [phone]     At the time of admission, it appears that the appropriate admission status for this patient is INPATIENT. This is judged to be reasonable and necessary in order to provide the required intensity of service to ensure the patient's safety given the presenting symptoms, physical exam findings, and initial radiographic and laboratory data in the context of their chronic comorbidities.  Together, these circumstances are felt to place her/him at high risk for further clinical deterioration threatening life, limb, or organ. The following factors support the admission status of inpatient:   A. The patient's presenting symptoms include dyspnea, cough, wheezing B. The worrisome physical exam findings include increased work of breathing initially, cough, wheezing C. The initial radiographic and laboratory data are worrisome because of chronic renal failure, anemia D. The chronic co-morbidities include end stage renal disease on dialysis, chronic diastolic congestive heart failure, hypertension, COPD E. Patient requires inpatient status due to high intensity of service, high risk for further deterioration and high frequency of surveillance required because of this severe exacerbation of their chronic organ failure F. I certify that at the point of admission it is my clinical judgment that the patient will  require inpatient hospital care spanning beyond 2 midnights from the point of admission and that early discharge would result in unnecessary risk of decompensation and readmission or threat to life, limb or bodily function.

## 2016-05-03 NOTE — Care Management (Signed)
Noted CM consult for nebulizer for home use. MD please order as DME if needed.  Thank you,

## 2016-05-03 NOTE — ED Notes (Signed)
Pt was placed on 2 L [place] 

## 2016-05-03 NOTE — ED Notes (Signed)
Sp02 when getting outside of room door 89% on room air. When arriving into room was 94% on room air. Pt then started coughing and Sp02 dropped to 84% on room air while coughing. Pt stated she felt SOB while walking however did not appear to be in distress.   Pt now stating at 90% on room air while laying down.

## 2016-05-04 DIAGNOSIS — I5032 Chronic diastolic (congestive) heart failure: Secondary | ICD-10-CM

## 2016-05-04 DIAGNOSIS — N185 Chronic kidney disease, stage 5: Secondary | ICD-10-CM

## 2016-05-04 DIAGNOSIS — Z992 Dependence on renal dialysis: Secondary | ICD-10-CM

## 2016-05-04 DIAGNOSIS — D631 Anemia in chronic kidney disease: Secondary | ICD-10-CM

## 2016-05-04 DIAGNOSIS — I10 Essential (primary) hypertension: Secondary | ICD-10-CM

## 2016-05-04 DIAGNOSIS — N186 End stage renal disease: Secondary | ICD-10-CM

## 2016-05-04 LAB — CBC
HCT: 31.3 % — ABNORMAL LOW (ref 36.0–46.0)
Hemoglobin: 10.2 g/dL — ABNORMAL LOW (ref 12.0–15.0)
MCH: 32 pg (ref 26.0–34.0)
MCHC: 32.6 g/dL (ref 30.0–36.0)
MCV: 98.1 fL (ref 78.0–100.0)
PLATELETS: 206 10*3/uL (ref 150–400)
RBC: 3.19 MIL/uL — ABNORMAL LOW (ref 3.87–5.11)
RDW: 14.3 % (ref 11.5–15.5)
WBC: 12.2 10*3/uL — AB (ref 4.0–10.5)

## 2016-05-04 LAB — BASIC METABOLIC PANEL
Anion gap: 13 (ref 5–15)
BUN: 68 mg/dL — AB (ref 6–20)
CALCIUM: 8.6 mg/dL — AB (ref 8.9–10.3)
CO2: 25 mmol/L (ref 22–32)
CREATININE: 5.76 mg/dL — AB (ref 0.44–1.00)
Chloride: 100 mmol/L — ABNORMAL LOW (ref 101–111)
GFR, EST AFRICAN AMERICAN: 8 mL/min — AB (ref >60)
GFR, EST NON AFRICAN AMERICAN: 7 mL/min — AB (ref >60)
Glucose, Bld: 149 mg/dL — ABNORMAL HIGH (ref 65–99)
Potassium: 4.2 mmol/L (ref 3.5–5.1)
SODIUM: 138 mmol/L (ref 135–145)

## 2016-05-04 MED ORDER — SODIUM CHLORIDE 0.9% FLUSH: 3.0000 mL | INTRAVENOUS | Status: DC | PRN

## 2016-05-04 MED ORDER — CALCITRIOL 0.5 MCG PO CAPS
0.7500 ug | ORAL_CAPSULE | ORAL | Status: DC
  Filled 2016-05-04: qty 1

## 2016-05-04 MED ORDER — SODIUM CHLORIDE 0.9% FLUSH
3.0000 mL | Freq: Two times a day (BID) | INTRAVENOUS | Status: DC
  Administered 2016-05-04 – 2016-05-06 (×5): 3 mL via INTRAVENOUS

## 2016-05-04 NOTE — Consult Note (Signed)
[facility] Consultation Note Requesting Physician:  Dr. Arthor CaptainElmahi Primary Nephrologist: [facility]) Reason for Consult:  Provision of HD and ESRD related services  HPI: The patient is a 65 y.o. year-old WF with COPD, ESRD (TTS [facility]), dCHF,  h/o MI (per pt). [place] [facility]. Admitted with cough, SOB, wheezing. CXR with no edema or PNA, + small effusions. Viral panel + for rhinovirus.  Treated as COPD flare. Anticipating discharge tomorrow, today is HD day.  Still with junky cough, requiring O2, dyspneic walking to the BR.   Past Medical History:  Diagnosis Date  . CHF (congestive heart failure) (HCC)   . COPD (chronic obstructive pulmonary disease) (HCC)   . Coronary artery disease   . ESRD (end stage renal disease) on dialysis (HCC)   . Hypertension   . Pneumonia     Past Surgical History: History reviewed. No pertinent surgical history.   Family History  Problem Relation Age of Onset  . Heart attack Mother    Social History:  reports that she has quit smoking. She has never used smokeless tobacco. She reports that she does not drink alcohol or use drugs.  Allergies: No Known Allergies  Home medications: Prior to Admission medications   Medication Sig Start Date End Date Taking? Authorizing Provider  albuterol (PROVENTIL HFA;VENTOLIN HFA) 108 (90 Base) MCG/ACT inhaler Inhale 1-2 puffs into the lungs every 6 (six) hours as needed for wheezing or shortness of breath.   Yes Historical Provider, MD  amLODipine (NORVASC) 5 MG tablet Take 5 mg by mouth See admin instructions. On Tuesday Thursday and Saturday only take once a day, all other days take twice daily.   Yes Historical Provider, MD  carvedilol (COREG) 25 MG tablet Take 25 mg by mouth See admin instructions. On Tuesday Thursday and Saturday only take once a day, all other days take twice daily.   Yes Historical Provider, MD  sevelamer carbonate (RENVELA) 800 MG  tablet Take 800 mg by mouth 3 (three) times daily with meals.   Yes Historical Provider, MD    Inpatient medications: . amLODipine  5 mg Oral Once per day on Sun Mon Wed Fri   And  . amLODipine  5 mg Oral QHS  . carvedilol  25 mg Oral Once per day on Tue Thu Sat   And  . carvedilol  25 mg Oral 2 times per day on Sun Mon Wed Fri  . enoxaparin (LOVENOX) injection  30 mg Subcutaneous Daily  . guaiFENesin  600 mg Oral BID  . ipratropium-albuterol  3 mL Nebulization TID  . predniSONE  50 mg Oral Q breakfast  . sevelamer carbonate  800 mg Oral TID WC  . sodium chloride flush  3 mL Intravenous Q12H    Review of Systems As per HPI  Physical Exam:  Blood pressure (!) 173/90, pulse 85, temperature 98.7 F (37.1 C), temperature source Oral, resp. rate 18, height 5\' 4"  (1.626 m), weight 53.8 kg (118 lb 11.2 oz), SpO2 93 %.  Gen: Thin, sallow complected, pleasant WF Coughing No skin rash R sided TDC dry dressing Anteriorly clear, but with some exp wheezes with deep breathing S1S2 No S3 Abd soft no tenderness No edema of LE's Dialysis Access: R TDC (no aVF yet)   Recent Labs Lab 05/02/16 2047 05/03/16 0532 05/04/16 0556  NA 141 141 138  K 3.6 4.2 4.2  CL 100* 101 100*  CO2 GLUCOSE 106* 145* 149*  BUN 21* 31* 68*  CREATININE 3.69* 4.41* 5.76*  CALCIUM 8.7* 8.7* 8.6*     Recent Labs Lab 05/02/16 2047 05/03/16 0532 05/04/16 0556  WBC 11.6* 9.5 12.2*  HGB 11.3* 11.2* 10.2*  HCT 35.5* 35.2* 31.3*  MCV 99.7 99.2 98.1  PLT 221 201 206   Xrays/Other Studies: Dg Chest 2 View  Result Date: 05/02/2016 CLINICAL DATA:  Pt c/o generalized chest pain and productive cough x 2 days. Hx of CHF, CAD, COPD, ESRD, HTN, AND PNA. Pt is a current smoker. EXAM: CHEST  2 VIEW COMPARISON:  None. FINDINGS: Right-sided dual-lumen central venous line tip overlies the level of the lower superior vena cava. Lungs are hyperinflated. Heart size is normal. There are bilateral pleural  effusions. Bibasilar opacities are consistent with infiltrates and/or atelectasis. IMPRESSION: 1. Hyperinflated lungs. 2. Bibasilar atelectasis or infiltrates and bilateral pleural effusions. Electronically Signed   By: Norva Pavlov M.D.   On: 05/02/2016 21:39   Dialysis Prescription TTS [facility] 4 hours 160 400/800 TDC 2/2.5 Aranesp 40/week last dosed 11/7 Hep 4000 units Venofer 50 /week (s/p 5 dose load) Assessment/Recommendations  1. ESRD - TTS [place]. HD today.  2. Rhinovirus + 3. COPD exacerbation - steroids/nebs 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 I can't find PTH in outpt HD records. Continue sevelamer. 5. DCHF 6. H/O MI (per pt) 7. 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 Aranesp 40/week last dosed 11/7. Just finished outpt Fe load of 500 mg. Weekgly Fe on Thursdays now   Camille Bal,  MD [facility] [phone] pager 05/04/2016, 11:13 AM

## 2016-05-04 NOTE — Progress Notes (Signed)
PROGRESS NOTE  Nancy Macdonald  ZOX:[id number] DOB: 12/04/1950 DOA: 05/02/2016 PCP: No primary care provider on file. Outpatient Specialists:  Subjective: Feels okay, denies any new complaints, cough with minimal sputum production  Brief Narrative:  Nancy HittCarrie Zywicki is a 65 y.o. female with a past medical history significant for COPD, CHF unknown type, and ESRD on HD TThS who presents with 5 days worsening dyspnea and cough.  Patient was in her usual state of health until last Saturday when she felt like she was "catching a cold". Since then her symptoms getting progressively worse with cough, nonproductive cough, wheezing, shortness of breath with exertion, and dyspnea. Tuesday after dialysis, her daughter told her that her "lips were blue". Then today she had rib and sternal pain after coughing for a long time, should she came to the emergency room.  She has had no fever, sputum, chills. She has had no change in her chronic orthopnea, no leg swelling, no paroxysmal nocturnal dyspnea.  Assessment & Plan:   Principal Problem:   COPD with exacerbation (HCC) Active Problems:   Chronic diastolic CHF (congestive heart failure) (HCC)   ESRD (end stage renal disease) on dialysis (HCC)   Essential hypertension   Anemia of chronic renal failure, stage 5 (HCC)   COPD exacerbation (HCC)   COPD exacerbation -Presented with cough, minimally productive sputum, SOB and wheezing. -Respiratory panel is positive for Rhinovirus / Enterovirus. -On IV Rocephin and a azithromycin as well as oral prednisone. -Continue supportive management with bronchodilators, mucolytics, antitussives and oxygen as needed. -Continue current respiratory regimen, evaluate for discharge in a.m.  ESRD [place], [place] Nephrology consulted for dialysis.  Chronic diastolic CHF -Based on notes, no evidence of fluid overload.  Essential hypertension -Blood pressure appears to be  controlled.  DVT prophylaxis: Subcutaneous heparin Code Status: Full Code Family Communication:  Disposition Plan:  Diet: Diet renal/carb modified with fluid restriction Diet-HS Snack? Nothing; Room service appropriate? Yes; Fluid consistency: Thin  Consultants:   Nephrology  Procedures:   None  Antimicrobials:   Rocephin and azithromycin  Objective: Vitals:   05/03/16 2137 05/03/16 2309 05/04/16 0518 05/04/16 0935  BP: (!) 184/99 (!) 160/88 (!) 159/75 (!) 173/90  Pulse: 77 78 77 85  Resp: 16  16 18   Temp: 97.9 F (36.6 C)  97.9 F (36.6 C) 98.7 F (37.1 C)  TempSrc: Oral  Oral Oral  SpO2: 95%  94% 93%  Weight: 53.8 kg (118 lb 11.2 oz)     Height:        Intake/Output Summary (Last 24 hours) at 05/04/16 1149 Last data filed at 05/04/16 0900  Gross per 24 hour  Intake              720 ml  Output              200 ml  Net              520 ml   Filed Weights   05/03/16 0400 05/03/16 2137  Weight: 52.2 kg (115 lb) 53.8 kg (118 lb 11.2 oz)    Examination: General exam: Appears calm and comfortable  Respiratory system: Clear to auscultation. Respiratory effort normal. Cardiovascular system: S1 & S2 heard, RRR. No JVD, murmurs, rubs, gallops or clicks. No pedal edema. Gastrointestinal system: Abdomen is nondistended, soft and nontender. No organomegaly or masses felt. Normal bowel sounds heard. Central nervous system: Alert and oriented. No focal neurological deficits. Extremities: Symmetric 5 x 5 power.  Skin: No rashes, lesions or ulcers Psychiatry: Judgement and insight appear normal. Mood & affect appropriate.   Data Reviewed: I have personally reviewed following labs and imaging studies  CBC:  Recent Labs Lab 05/02/16 2047 05/03/16 0532 05/04/16 0556  WBC 11.6* 9.5 12.2*  HGB 11.3* 11.2* 10.2*  HCT 35.5* 35.2* 31.3*  MCV 99.7 99.2 98.1  PLT 221 201 206   Basic Metabolic Panel:  Recent Labs Lab 05/02/16 2047 05/03/16 0532 05/04/16 0556  NA  141 141 138  K 3.6 4.2 4.2  CL 100* 101 100*  CO2 31 29 25   GLUCOSE 106* 145* 149*  BUN 21* 31* 68*  CREATININE 3.69* 4.41* 5.76*  CALCIUM 8.7* 8.7* 8.6*   GFR: Estimated Creatinine Clearance: 8.5 mL/min (by C-G formula based on SCr of 5.76 mg/dL (H)). Liver Function Tests: No results for input(s): AST, ALT, ALKPHOS, BILITOT, PROT, ALBUMIN in the last 168 hours. No results for input(s): LIPASE, AMYLASE in the last 168 hours. No results for input(s): AMMONIA in the last 168 hours. Coagulation Profile: No results for input(s): INR, PROTIME in the last 168 hours. Cardiac Enzymes:  Recent Labs Lab 05/03/16 0532  TROPONINI <0.03   BNP (last 3 results) No results for input(s): PROBNP in the last 8760 hours. HbA1C: No results for input(s): HGBA1C in the last 72 hours. CBG: No results for input(s): GLUCAP in the last 168 hours. Lipid Profile: No results for input(s): CHOL, HDL, LDLCALC, TRIG, CHOLHDL, LDLDIRECT in the last 72 hours. Thyroid Function Tests: No results for input(s): TSH, T4TOTAL, FREET4, T3FREE, THYROIDAB in the last 72 hours. Anemia Panel: No results for input(s): VITAMINB12, FOLATE, FERRITIN, TIBC, IRON, RETICCTPCT in the last 72 hours. Urine analysis: No results found for: COLORURINE, APPEARANCEUR, LABSPEC, PHURINE, GLUCOSEU, HGBUR, BILIRUBINUR, KETONESUR, PROTEINUR, UROBILINOGEN, NITRITE, LEUKOCYTESUR Sepsis Labs: @LABRCNTIP (procalcitonin:4,lacticidven:4)  ) Recent Results (from the past 240 hour(s))  Respiratory Panel by PCR     Status: Abnormal   Collection Time: 05/03/16  2:33 AM  Result Value Ref Range Status   Adenovirus NOT DETECTED NOT DETECTED Final   Coronavirus 229E NOT DETECTED NOT DETECTED Final   Coronavirus HKU1 NOT DETECTED NOT DETECTED Final   Coronavirus NL63 NOT DETECTED NOT DETECTED Final   Coronavirus OC43 NOT DETECTED NOT DETECTED Final   Metapneumovirus NOT DETECTED NOT DETECTED Final   Rhinovirus / Enterovirus DETECTED (A) NOT  DETECTED Final   Influenza A NOT DETECTED NOT DETECTED Final   Influenza B NOT DETECTED NOT DETECTED Final   Parainfluenza Virus 1 NOT DETECTED NOT DETECTED Final   Parainfluenza Virus 2 NOT DETECTED NOT DETECTED Final   Parainfluenza Virus 3 NOT DETECTED NOT DETECTED Final   Parainfluenza Virus 4 NOT DETECTED NOT DETECTED Final   Respiratory Syncytial Virus NOT DETECTED NOT DETECTED Final   Bordetella pertussis NOT DETECTED NOT DETECTED Final   Chlamydophila pneumoniae NOT DETECTED NOT DETECTED Final   Mycoplasma pneumoniae NOT DETECTED NOT DETECTED Final     Invalid input(s): PROCALCITONIN, LACTICACIDVEN   Radiology Studies: Dg Chest 2 View  Result Date: 05/02/2016 CLINICAL DATA:  Pt c/o generalized chest pain and productive cough x 2 days. Hx of CHF, CAD, COPD, ESRD, HTN, AND PNA. Pt is a current smoker. EXAM: CHEST  2 VIEW COMPARISON:  None. FINDINGS: Right-sided dual-lumen central venous line tip overlies the level of the lower superior vena cava. Lungs are hyperinflated. Heart size is normal. There are bilateral pleural effusions. Bibasilar opacities are consistent with infiltrates and/or atelectasis. IMPRESSION: 1. Hyperinflated  lungs. 2. Bibasilar atelectasis or infiltrates and bilateral pleural effusions. Electronically Signed   By: Norva PavlovElizabeth  Brown M.D.   On: 05/02/2016 21:39        Scheduled Meds: . amLODipine  5 mg Oral Once per day on Sun Mon Wed Fri   And  . amLODipine  5 mg Oral QHS  . calcitRIOL  0.75 mcg Oral Q T,Th,Sa-HD  . carvedilol  25 mg Oral Once per day on Tue Thu Sat   And  . carvedilol  25 mg Oral 2 times per day on Sun Mon Wed Fri  . enoxaparin (LOVENOX) injection  30 mg Subcutaneous Daily  . guaiFENesin  600 mg Oral BID  . ipratropium-albuterol  3 mL Nebulization TID  . predniSONE  50 mg Oral Q breakfast  . sevelamer carbonate  800 mg Oral TID WC  . sodium chloride flush  3 mL Intravenous Q12H   Continuous Infusions:   LOS: 1 day    Time  spent: 35 minutes    Ansen Sayegh A, MD [facility] Pager [phone]  If 7PM-7AM, please contact night-coverage [email or web address] Password [email or web address] 05/04/2016, 11:49 AM

## 2016-05-04 NOTE — Procedures (Signed)
I have personally attended this patient's dialysis session.   Under EDW so challenging and post weight will be new EDW (Has been leaving slightly under EDW at outpt unit) 2K bath TDC  Nancy Balynthia Jihad Brownlow, MD [facility] [phone] Pager 05/04/2016, 4:18 PM

## 2016-05-04 NOTE — Plan of Care (Signed)
Problem: Respiratory: Goal: Ability to maintain a clear airway will improve Outcome: Progressing No resp. distress noted.

## 2016-05-05 LAB — PROCALCITONIN: PROCALCITONIN: 0.4 ng/mL

## 2016-05-05 MED ORDER — IPRATROPIUM-ALBUTEROL 0.5-2.5 (3) MG/3ML IN SOLN
3.0000 mL | Freq: Three times a day (TID) | RESPIRATORY_TRACT | Status: DC
  Administered 2016-05-06 (×2): 3 mL via RESPIRATORY_TRACT
  Filled 2016-05-05 (×2): qty 3

## 2016-05-05 MED ORDER — METHYLPREDNISOLONE SODIUM SUCC 125 MG IJ SOLR
80.0000 mg | Freq: Three times a day (TID) | INTRAMUSCULAR | Status: DC
  Administered 2016-05-05 – 2016-05-06 (×4): 80 mg via INTRAVENOUS
  Filled 2016-05-05 (×4): qty 2

## 2016-05-05 MED ORDER — IPRATROPIUM-ALBUTEROL 0.5-2.5 (3) MG/3ML IN SOLN
3.0000 mL | Freq: Four times a day (QID) | RESPIRATORY_TRACT | Status: DC
  Administered 2016-05-05 (×3): 3 mL via RESPIRATORY_TRACT
  Filled 2016-05-05 (×3): qty 3

## 2016-05-05 NOTE — Progress Notes (Signed)
PROGRESS NOTE  Nancy Macdonald  ZDG:[id number] DOB: 1950/11/09 DOA: 05/02/2016 PCP: No primary care provider on file. Outpatient Specialists:  Subjective: Complaining about shortness of breath which if not worsened than yesterday not getting better. Continue to have cough, minimal sputum production. "I am not feeling better".  Brief Narrative:  Nancy Macdonald is a 65 y.o. female with a past medical history significant for COPD, CHF unknown type, and ESRD on HD TThS who presents with 5 days worsening dyspnea and cough.  Patient was in her usual state of health until last Saturday when she felt like she was "catching a cold". Since then her symptoms getting progressively worse with cough, nonproductive cough, wheezing, shortness of breath with exertion, and dyspnea. Tuesday after dialysis, her daughter told her that her "lips were blue". Then today she had rib and sternal pain after coughing for a long time, should she came to the emergency room.  She has had no fever, sputum, chills. She has had no change in her chronic orthopnea, no leg swelling, no paroxysmal nocturnal dyspnea.  Assessment & Plan:   Principal Problem:   COPD with exacerbation (HCC) Active Problems:   Chronic diastolic CHF (congestive heart failure) (HCC)   ESRD (end stage renal disease) on dialysis (HCC)   Essential hypertension   Anemia of chronic renal failure, stage 5 (HCC)   COPD exacerbation (HCC)   COPD exacerbation, rhinovirus infection -Presented with cough, minimally productive sputum, SOB and wheezing. -Respiratory panel is positive for Rhinovirus / Enterovirus. -On IV Rocephin and a azithromycin as well as oral prednisone. -Continue supportive management with bronchodilators, mucolytics, antitussives and oxygen as needed. -Still has significant cough and shortness of breath, switch steroids to IV, intensify flutter valve and nebulizers.  ESRD -Dialysis on Tuesday, Thursday and Saturday in [facility]. -Nephrology consulted for dialysis.  Chronic diastolic CHF -Based on notes, no evidence of fluid overload.  Essential hypertension -Blood pressure appears to be controlled.  DVT prophylaxis: Subcutaneous heparin Code Status: Full Code Family Communication:  Disposition Plan:  Diet: Diet renal/carb modified with fluid restriction Diet-HS Snack? Nothing; Room service appropriate? Yes; Fluid consistency: Thin  Consultants:   Nephrology  Procedures:   None  Antimicrobials:   Rocephin and azithromycin  Objective: Vitals:   05/04/16 2140 05/05/16 0457 05/05/16 0839 05/05/16 1000  BP: (!) 149/90 (!) 161/90  (!) 154/90  Pulse: 84 76  74  Resp: 16 16  18   Temp: 98.4 F (36.9 C) 98.1 F (36.7 C)  98 F (36.7 C)  TempSrc: Oral Oral  Oral  SpO2: 91% 92% 94% 96%  Weight: 51.3 kg (113 lb)     Height: 5\' 4"  (1.626 m)       Intake/Output Summary (Last 24 hours) at 05/05/16 1027 Last data filed at 05/05/16 0900  Gross per 24 hour  Intake              840 ml  Output             2550 ml  Net            -1710 ml   Filed Weights   05/04/16 1324 05/04/16 1738 05/04/16 2140  Weight: 53.3 kg (117 lb 8.1 oz) 51.2 kg (112 lb 14 oz) 51.3 kg (113 lb)    Examination: General exam: Appears calm and comfortable  Respiratory system: Clear to auscultation. Respiratory effort normal. Cardiovascular system: S1 & S2 heard, RRR. No JVD, murmurs, rubs, gallops or clicks. No pedal  edema. Gastrointestinal system: Abdomen is nondistended, soft and nontender. No organomegaly or masses felt. Normal bowel sounds heard. Central nervous system: Alert and oriented. No focal neurological deficits. Extremities: Symmetric 5 x 5 power. Skin: No rashes, lesions or ulcers Psychiatry: Judgement and insight appear normal. Mood & affect appropriate.   Data Reviewed: I have personally reviewed following labs and imaging studies  CBC:  Recent Labs Lab 05/02/16 2047 05/03/16 0532  05/04/16 0556  WBC 11.6* 9.5 12.2*  HGB 11.3* 11.2* 10.2*  HCT 35.5* 35.2* 31.3*  MCV 99.7 99.2 98.1  PLT 221 201 206   Basic Metabolic Panel:  Recent Labs Lab 05/02/16 2047 05/03/16 0532 05/04/16 0556  NA 141 141 138  K 3.6 4.2 4.2  CL 100* 101 100*  CO2 31 29 25   GLUCOSE 106* 145* 149*  BUN 21* 31* 68*  CREATININE 3.69* 4.41* 5.76*  CALCIUM 8.7* 8.7* 8.6*   GFR: Estimated Creatinine Clearance: 8.1 mL/min (by C-G formula based on SCr of 5.76 mg/dL (H)). Liver Function Tests: No results for input(s): AST, ALT, ALKPHOS, BILITOT, PROT, ALBUMIN in the last 168 hours. No results for input(s): LIPASE, AMYLASE in the last 168 hours. No results for input(s): AMMONIA in the last 168 hours. Coagulation Profile: No results for input(s): INR, PROTIME in the last 168 hours. Cardiac Enzymes:  Recent Labs Lab 05/03/16 0532  TROPONINI <0.03   BNP (last 3 results) No results for input(s): PROBNP in the last 8760 hours. HbA1C: No results for input(s): HGBA1C in the last 72 hours. CBG: No results for input(s): GLUCAP in the last 168 hours. Lipid Profile: No results for input(s): CHOL, HDL, LDLCALC, TRIG, CHOLHDL, LDLDIRECT in the last 72 hours. Thyroid Function Tests: No results for input(s): TSH, T4TOTAL, FREET4, T3FREE, THYROIDAB in the last 72 hours. Anemia Panel: No results for input(s): VITAMINB12, FOLATE, FERRITIN, TIBC, IRON, RETICCTPCT in the last 72 hours. Urine analysis: No results found for: COLORURINE, APPEARANCEUR, LABSPEC, PHURINE, GLUCOSEU, HGBUR, BILIRUBINUR, KETONESUR, PROTEINUR, UROBILINOGEN, NITRITE, LEUKOCYTESUR Sepsis Labs: @LABRCNTIP (procalcitonin:4,lacticidven:4)  ) Recent Results (from the past 240 hour(s))  Respiratory Panel by PCR     Status: Abnormal   Collection Time: 05/03/16  2:33 AM  Result Value Ref Range Status   Adenovirus NOT DETECTED NOT DETECTED Final   Coronavirus 229E NOT DETECTED NOT DETECTED Final   Coronavirus HKU1 NOT DETECTED  NOT DETECTED Final   Coronavirus NL63 NOT DETECTED NOT DETECTED Final   Coronavirus OC43 NOT DETECTED NOT DETECTED Final   Metapneumovirus NOT DETECTED NOT DETECTED Final   Rhinovirus / Enterovirus DETECTED (A) NOT DETECTED Final   Influenza A NOT DETECTED NOT DETECTED Final   Influenza B NOT DETECTED NOT DETECTED Final   Parainfluenza Virus 1 NOT DETECTED NOT DETECTED Final   Parainfluenza Virus 2 NOT DETECTED NOT DETECTED Final   Parainfluenza Virus 3 NOT DETECTED NOT DETECTED Final   Parainfluenza Virus 4 NOT DETECTED NOT DETECTED Final   Respiratory Syncytial Virus NOT DETECTED NOT DETECTED Final   Bordetella pertussis NOT DETECTED NOT DETECTED Final   Chlamydophila pneumoniae NOT DETECTED NOT DETECTED Final   Mycoplasma pneumoniae NOT DETECTED NOT DETECTED Final     Invalid input(s): PROCALCITONIN, LACTICACIDVEN   Radiology Studies: No results found.      Scheduled Meds: . amLODipine  5 mg Oral Once per day on Sun Mon Wed Fri   And  . amLODipine  5 mg Oral QHS  . calcitRIOL  0.75 mcg Oral Q T,Th,Sa-HD  . carvedilol  25  mg Oral Once per day on Tue Thu Sat   And  . carvedilol  25 mg Oral 2 times per day on Sun Mon Wed Fri  . enoxaparin (LOVENOX) injection  30 mg Subcutaneous Daily  . guaiFENesin  600 mg Oral BID  . ipratropium-albuterol  3 mL Nebulization TID  . predniSONE  50 mg Oral Q breakfast  . sevelamer carbonate  800 mg Oral TID WC  . sodium chloride flush  3 mL Intravenous Q12H   Continuous Infusions:   LOS: 2 days    Time spent: 35 minutes    Allesandra Huebsch A, MD [facility] Pager [phone]  If 7PM-7AM, please contact night-coverage [email or web address] Password [email or web address] 05/05/2016, 10:27 AM

## 2016-05-05 NOTE — Care Management (Addendum)
CM spoke with patient to discuss recommendation for home neb machine, patient is agreeable. Offered choice [facility] selected . CM awaiting orders to place referral.  Home oxygen evaluation performed, patient did not meet criteria of need according to Medicare guidelines for home oxygen. CM will continue to follow-up.for neb machine.

## 2016-05-05 NOTE — Progress Notes (Signed)
Patient's oxygen level is 95% on room air.  Took patient walking over half the length of the entire hallway and back to the room.  Oxygen level never dropped below 91% on room air.

## 2016-05-05 NOTE — Progress Notes (Signed)
[facility] Rounding Note  Subjective/Interval History:  Had HD yesterday Pulled extra fluid as pt was already below EDW and hypertensive Will have lower EDW at discharge. Says doesn't feel any better - still coughing and with some chest discomfort but not wearing O2 this AM   Objective Vital signs in last 24 hours: Vitals:   05/04/16 2014 05/04/16 2140 05/05/16 0457 05/05/16 0839  BP:  (!) 149/90 (!) 161/90   Pulse:  84 76   Resp:  16 16   Temp:  98.4 F (36.9 C) 98.1 F (36.7 C)   TempSrc:  Oral Oral   SpO2: 97% 91% 92% 94%  Weight:  51.3 kg (113 lb)    Height:  5\' 4"  (1.626 m)     Weight change: -0.542 kg (-1 lb 3.1 oz)  Intake/Output Summary (Last 24 hours) at 05/05/16 0932 Last data filed at 05/05/16 0457  Gross per 24 hour  Intake              600 ml  Output             2350 ml  Net            -1750 ml   Physical Exam:  Blood pressure (!) 161/90, pulse 76, temperature 98.1 F (36.7 C), temperature source Oral, resp. rate 16, height 5\' 4"  (1.626 m), weight 51.3 kg (113 lb), SpO2 94 %.   Thin, sallow complected, pleasant WF Lying in bed No O2 Starts Coughs when she speaks R sided TDC dry dressing Rhonchorous breath sounds bilaterally, no wheezes S1S2 No S3 Abd soft no tenderness No edema of LE's [facility]   Recent Labs Lab 05/02/16 2047 05/03/16 0532 05/04/16 0556  NA 141 141 138  K 3.6 4.2 4.2  CL 100* 101 100*  CO2 31 29 25   GLUCOSE 106* 145* 149*  BUN 21* 31* 68*  CREATININE 3.69* 4.41* 5.76*  CALCIUM 8.7* 8.7* 8.6*     Recent Labs Lab 05/02/16 2047 05/03/16 0532 05/04/16 0556  WBC 11.6* 9.5 12.2*  HGB 11.3* 11.2* 10.2*  HCT 35.5* 35.2* 31.3*  MCV 99.7 99.2 98.1  PLT 221 201 206    Recent Labs Lab 05/03/16 0532  TROPONINI <0.03   Medications:  . amLODipine  5 mg Oral Once per day on Sun Mon Wed Fri   And  . amLODipine  5 mg Oral QHS  . calcitRIOL  0.75 mcg Oral Q T,Th,Sa-HD  . carvedilol  25 mg Oral Once per day on Tue  Thu Sat   And  . carvedilol  25 mg Oral 2 times per day on Sun Mon Wed Fri  . enoxaparin (LOVENOX) injection  30 mg Subcutaneous Daily  . guaiFENesin  600 mg Oral BID  . ipratropium-albuterol  3 mL Nebulization TID  . predniSONE  50 mg Oral Q breakfast  . sevelamer carbonate  800 mg Oral TID WC  . sodium chloride flush  3 mL Intravenous Q12H   Dialysis Prescription TTS [facility] 4 hours 160 400/800 TDC EDW 52.5 kg will be lower ar discharge 2/2.5 Aranesp 40/week last dosed 11/7 Hep 4000 units Venofer 50 /week (s/p 5 dose load)  Background: 65 y.o. year-old WF with COPD, ESRD (TTS [facility]), dCHF,  h/o MI (per pt). [place] [facility] [place] [facility] [place] [facility] Admitted with cough, SOB, wheezing. CXR with no edema or PNA, + small effusions. Viral panel + for rhinovirus.  Treated as COPD flare  Assessment/Recommendations  1. ESRD - TTS [place]. HD yesterday. Post HD weight 51.2 kg so will have lower EDW at discharge. 2. Rhinovirus + 3. COPD exacerbation - steroids/nebs. Off O2 but still coughing and chest still "tight". Plans per primary svce. 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 I can't find PTH in outpt HD records. Continue sevelamer. 5. dCHF 6. H/O MI (per pt) 7. 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 Aranesp 40/week last dosed 11/7. Just finished outpt Fe load of 500 mg. Weekgly Fe on Thursdays now.  Camille Balynthia Hila Bolding, MD [facility] [phone] pager 05/05/2016, 9:32 AM

## 2016-05-06 ENCOUNTER — Inpatient Hospital Stay (HOSPITAL_COMMUNITY): Payer: Medicare Other

## 2016-05-06 DIAGNOSIS — I509 Heart failure, unspecified: Secondary | ICD-10-CM

## 2016-05-06 LAB — ECHOCARDIOGRAM COMPLETE
CHL CUP MV DEC (S): 201
E/e' ratio: 7.7
EWDT: 201 ms
FS: 31 % (ref 28–44)
Height: 64 in
IV/PV OW: 0.89
LA ID, A-P, ES: 34 mm
LA vol A4C: 34.4 ml
LA vol index: 24.4 mL/m2
LA vol: 38.3 mL
LADIAMINDEX: 2.17 cm/m2
LEFT ATRIUM END SYS DIAM: 34 mm
LV TDI E'LATERAL: 6.74
LVEEAVG: 7.7
LVEEMED: 7.7
LVELAT: 6.74 cm/s
LVOT area: 3.14 cm2
LVOTD: 20 mm
Lateral S' vel: 10.8 cm/s
MV pk A vel: 66.5 m/s
MVPKEVEL: 51.9 m/s
PW: 11.9 mm — AB (ref 0.6–1.1)
TAPSE: 20.7 mm
TDI e' medial: 5.66
Weight: 1897.72 oz

## 2016-05-06 MED ORDER — DEXTROSE 5 % IV SOLN
1.0000 g | Freq: Every day | INTRAVENOUS | Status: DC
  Administered 2016-05-06: 1 g via INTRAVENOUS
  Filled 2016-05-06: qty 10

## 2016-05-06 MED ORDER — GUAIFENESIN ER 600 MG PO TB12: 600.0000 mg | ORAL_TABLET | Freq: Two times a day (BID) | ORAL | 30 refills | Status: DC

## 2016-05-06 MED ORDER — LEVOFLOXACIN 750 MG PO TABS: 750.0000 mg | ORAL_TABLET | Freq: Every day | ORAL | 0 refills | Status: DC

## 2016-05-06 MED ORDER — PREDNISONE 10 MG (21) PO TBPK: ORAL_TABLET | ORAL | 0 refills | Status: DC

## 2016-05-06 MED ORDER — DEXTROSE 5 % IV SOLN
500.0000 mg | Freq: Every day | INTRAVENOUS | Status: DC
  Administered 2016-05-06: 500 mg via INTRAVENOUS
  Filled 2016-05-06: qty 500

## 2016-05-06 NOTE — Clinical Social Work Note (Signed)
Patient medically stable for discharge today and requested assistance with ride home. Patient's nurse talked by phone with daughter who explained that [place] [facility] and requested a gas voucher to assist her with getting her mother from hospital. Nurse supplied with cab voucher for patient.  ,Genelle BalVanessa Dhwani Venkatesh, MSW, LCSW Licensed Clinical Social Worker Clinical Social Work Department [facility] [phone]

## 2016-05-06 NOTE — Progress Notes (Signed)
  Patient discharged to home, IV and telemetry were discontinued. AVS was reviewed. Cab voucher provided. Patient left floor via wheelchair

## 2016-05-06 NOTE — Care Management Note (Signed)
Case Management Note  Patient Details  Name: Nancy Macdonald MRN: [id number] Date of Birth: [birth year]  Subjective/Objective:     CM following for progression and d/c planning.                Action/Plan: 05/06/2016 Noted pt did not qualify for home oxygen, hand held nebulizer ordered and [facility]. Pt for d/c to home with family and self care.   Expected Discharge Date:    05/06/2016              Expected Discharge Plan:  Home/Self Care  In-House Referral:  NA  Discharge planning Services  CM Consult  Post Acute Care Choice:  Durable Medical Equipment Choice offered to:  Patient  DME Arranged:  Nebulizer machine DME Agency:  [facility] NA  [facility] Agency:     Status of Service:  Completed, signed off  If discussed at Long Length of Stay Meetings, dates discussed:    Additional Comments:  Starlyn SkeansRoyal, Delaney Perona U, RN 05/06/2016, 2:27 PM

## 2016-05-06 NOTE — Progress Notes (Signed)
  Echocardiogram 2D Echocardiogram has been performed.  Nancy Macdonald, Nancy Macdonald 05/06/2016, 9:46 AM

## 2016-05-06 NOTE — Discharge Summary (Signed)
Physician Discharge Summary  [name] ZOX:[id number] DOB: [birth year] DOA: 05/02/2016  PCP: No primary care provider on file.  Admit date: 05/02/2016 Discharge date: 05/06/2016  Admitted From: Home Disposition: Home  Recommendations for Outpatient Follow-up:  1. Follow up with PCP in 1-2 weeks 2. Please obtain BMP/CBC in one week.  Home Health: NA Equipment/Devices:NA  Discharge Condition: Stable CODE STATUS: Full Code Diet recommendation: Diet renal with fluid restriction Fluid restriction: 1200 mL Fluid; Room service appropriate? Yes; Fluid consistency: Thin Diet - low sodium heart healthy  Brief/Interim Summary: Nancy Macdonald a 65 y.o.femalewith a past medical history significant for COPD, CHF unknown type, and ESRD on HD TThSwho presents with 5 days worsening dyspnea and cough. Patient was in her usual state of health until last Saturday when she felt like she was "catching a cold". Since then her symptoms getting progressively worse with cough, nonproductive cough, wheezing, shortness of breath with exertion, and dyspnea. Tuesday after dialysis, her daughter told her that her "lips were blue". Then today she had rib and sternal pain after coughing for a long time, should she came to the emergency room. She has had no fever, sputum, chills. She has had no change in her chronic orthopnea, no leg swelling, no paroxysmal nocturnal dyspnea.  Discharge Diagnoses:  Principal Problem:   COPD with exacerbation (HCC) Active Problems:   Chronic diastolic CHF (congestive heart failure) (HCC)   ESRD (end stage renal disease) on dialysis (HCC)   Essential hypertension   Anemia of chronic renal failure, stage 5 (HCC)   COPD exacerbation (HCC)   COPD exacerbation, rhinovirus infection -Presented with cough, minimally productive sputum, SOB and wheezing. -Respiratory panel is positive for Rhinovirus / Enterovirus. -Was IV Rocephin and a azithromycin as well as oral  prednisone. -Treated with supportive management with bronchodilators, mucolytics, antitussives and oxygen as needed. -Discharged home on levofloxacin, prednisone taper and Mucinex. -Did not need oxygen, 95% on room air at rest, 91% after ambulation on room air.  ESRD -Dialysis on Tuesday, Thursday and Saturday in she dialyzed regularly at [facility]. -Nephrology consulted for dialysis. To follow with her regular dialysis center for dialysis needs in a.m.  Chronic diastolic CHF -Based on notes, echo was pending at the time of discharge, no evidence of fluid overload.  Essential hypertension -Blood pressure appears to be controlled.   Discharge Instructions  Discharge Instructions    Diet - low sodium heart healthy    Complete by:  As directed    Increase activity slowly    Complete by:  As directed        Medication List    TAKE these medications   albuterol 108 (90 Base) MCG/ACT inhaler Commonly known as:  PROVENTIL HFA;VENTOLIN HFA Inhale 1-2 puffs into the lungs every 6 (six) hours as needed for wheezing or shortness of breath.   amLODipine 5 MG tablet Commonly known as:  NORVASC Take 5 mg by mouth See admin instructions. On Tuesday Thursday and Saturday only take once a day, all other days take twice daily.   carvedilol 25 MG tablet Commonly known as:  COREG Take 25 mg by mouth See admin instructions. On Tuesday Thursday and Saturday only take once a day, all other days take twice daily.   guaiFENesin 600 MG 12 hr tablet Commonly known as:  MUCINEX Take 1 tablet (600 mg total) by mouth 2 (two) times daily.   levofloxacin 750 MG tablet Commonly known as:  LEVAQUIN Take 1 tablet (750 mg total) by mouth daily.  predniSONE 10 MG (21) Tbpk tablet Commonly known as:  STERAPRED UNI-PAK 21 TAB Take 6-5-4-3-2-1 Po daily till gone   sevelamer carbonate 800 MG tablet Commonly known as:  RENVELA Take 800 mg by mouth 3 (three) times daily with meals.             Durable Medical Equipment        Start     Ordered   05/05/16 1250  For home use only DME Nebulizer/meds  Once     05/05/16 1249      No Known Allergies  Consultations:  Treatment Team:   Camille Balynthia Dunham, MD   Procedures (Echo, Carotid, EGD, Colonoscopy, ERCP)   Radiological studies: Dg Chest 2 View  Result Date: 05/02/2016 CLINICAL DATA:  Pt c/o generalized chest pain and productive cough x 2 days. Hx of CHF, CAD, COPD, ESRD, HTN, AND PNA. Pt is a current smoker. EXAM: CHEST  2 VIEW COMPARISON:  None. FINDINGS: Right-sided dual-lumen central venous line tip overlies the level of the lower superior vena cava. Lungs are hyperinflated. Heart size is normal. There are bilateral pleural effusions. Bibasilar opacities are consistent with infiltrates and/or atelectasis. IMPRESSION: 1. Hyperinflated lungs. 2. Bibasilar atelectasis or infiltrates and bilateral pleural effusions. Electronically Signed   By: Norva PavlovElizabeth  Brown M.D.   On: 05/02/2016 21:39     Subjective:  Discharge Exam: Vitals:   05/06/16 0553 05/06/16 0733 05/06/16 0800 05/06/16 0936  BP: (!) 157/96  (!) 160/83 (!) 164/90  Pulse: 85  78   Resp: 20  20   Temp: 98.6 F (37 C)  98 F (36.7 C)   TempSrc: Oral  Oral   SpO2: 93% 90% 93%   Weight:      Height:       General: Pt is alert, awake, not in acute distress Cardiovascular: RRR, S1/S2 +, no rubs, no gallops Respiratory: CTA bilaterally, no wheezing, no rhonchi Abdominal: Soft, NT, ND, bowel sounds + Extremities: no edema, no cyanosis   The results of significant diagnostics from this hospitalization (including imaging, microbiology, ancillary and laboratory) are listed below for reference.    Microbiology: Recent Results (from the past 240 hour(s))  Respiratory Panel by PCR     Status: Abnormal   Collection Time: 05/03/16  2:33 AM  Result Value Ref Range Status   Adenovirus NOT DETECTED NOT DETECTED Final   Coronavirus 229E NOT DETECTED NOT DETECTED  Final   Coronavirus HKU1 NOT DETECTED NOT DETECTED Final   Coronavirus NL63 NOT DETECTED NOT DETECTED Final   Coronavirus OC43 NOT DETECTED NOT DETECTED Final   Metapneumovirus NOT DETECTED NOT DETECTED Final   Rhinovirus / Enterovirus DETECTED (A) NOT DETECTED Final   Influenza A NOT DETECTED NOT DETECTED Final   Influenza B NOT DETECTED NOT DETECTED Final   Parainfluenza Virus 1 NOT DETECTED NOT DETECTED Final   Parainfluenza Virus 2 NOT DETECTED NOT DETECTED Final   Parainfluenza Virus 3 NOT DETECTED NOT DETECTED Final   Parainfluenza Virus 4 NOT DETECTED NOT DETECTED Final   Respiratory Syncytial Virus NOT DETECTED NOT DETECTED Final   Bordetella pertussis NOT DETECTED NOT DETECTED Final   Chlamydophila pneumoniae NOT DETECTED NOT DETECTED Final   Mycoplasma pneumoniae NOT DETECTED NOT DETECTED Final     Labs: BNP (last 3 results)  Recent Labs  05/02/16 0002  BNP 550.8*   Basic Metabolic Panel:  Recent Labs Lab 05/02/16 2047 05/03/16 0532 05/04/16 0556  NA 141 141 138  K 3.6 4.2 4.2  CL 100* 101 100*  CO2 31 29 25   GLUCOSE 106* 145* 149*  BUN 21* 31* 68*  CREATININE 3.69* 4.41* 5.76*  CALCIUM 8.7* 8.7* 8.6*   Liver Function Tests: No results for input(s): AST, ALT, ALKPHOS, BILITOT, PROT, ALBUMIN in the last 168 hours. No results for input(s): LIPASE, AMYLASE in the last 168 hours. No results for input(s): AMMONIA in the last 168 hours. CBC:  Recent Labs Lab 05/02/16 2047 05/03/16 0532 05/04/16 0556  WBC 11.6* 9.5 12.2*  HGB 11.3* 11.2* 10.2*  HCT 35.5* 35.2* 31.3*  MCV 99.7 99.2 98.1  PLT 221 201 206   Cardiac Enzymes:  Recent Labs Lab 05/03/16 0532  TROPONINI <0.03   BNP: Invalid input(s): POCBNP CBG: No results for input(s): GLUCAP in the last 168 hours. D-Dimer No results for input(s): DDIMER in the last 72 hours. Hgb A1c No results for input(s): HGBA1C in the last 72 hours. Lipid Profile No results for input(s): CHOL, HDL, LDLCALC,  TRIG, CHOLHDL, LDLDIRECT in the last 72 hours. Thyroid function studies No results for input(s): TSH, T4TOTAL, T3FREE, THYROIDAB in the last 72 hours.  Invalid input(s): FREET3 Anemia work up No results for input(s): VITAMINB12, FOLATE, FERRITIN, TIBC, IRON, RETICCTPCT in the last 72 hours. Urinalysis No results found for: COLORURINE, APPEARANCEUR, LABSPEC, PHURINE, GLUCOSEU, HGBUR, BILIRUBINUR, KETONESUR, PROTEINUR, UROBILINOGEN, NITRITE, LEUKOCYTESUR Sepsis Labs Invalid input(s): PROCALCITONIN,  WBC,  LACTICIDVEN Microbiology Recent Results (from the past 240 hour(s))  Respiratory Panel by PCR     Status: Abnormal   Collection Time: 05/03/16  2:33 AM  Result Value Ref Range Status   Adenovirus NOT DETECTED NOT DETECTED Final   Coronavirus 229E NOT DETECTED NOT DETECTED Final   Coronavirus HKU1 NOT DETECTED NOT DETECTED Final   Coronavirus NL63 NOT DETECTED NOT DETECTED Final   Coronavirus OC43 NOT DETECTED NOT DETECTED Final   Metapneumovirus NOT DETECTED NOT DETECTED Final   Rhinovirus / Enterovirus DETECTED (A) NOT DETECTED Final   Influenza A NOT DETECTED NOT DETECTED Final   Influenza B NOT DETECTED NOT DETECTED Final   Parainfluenza Virus 1 NOT DETECTED NOT DETECTED Final   Parainfluenza Virus 2 NOT DETECTED NOT DETECTED Final   Parainfluenza Virus 3 NOT DETECTED NOT DETECTED Final   Parainfluenza Virus 4 NOT DETECTED NOT DETECTED Final   Respiratory Syncytial Virus NOT DETECTED NOT DETECTED Final   Bordetella pertussis NOT DETECTED NOT DETECTED Final   Chlamydophila pneumoniae NOT DETECTED NOT DETECTED Final   Mycoplasma pneumoniae NOT DETECTED NOT DETECTED Final     Time coordinating discharge: Over 30 minutes  SIGNED:   Clint LippsELMAHI,[place] A, MD  [facility] 05/06/2016, 10:04 AM Pager   If 7PM-7AM, please contact night-coverage [email or web address] Password [email or web address]

## 2016-05-06 NOTE — Progress Notes (Signed)
Subjective:  No sob currently / feels beter than yesterday.   Objective Vital signs in last 24 hours: Vitals:   05/06/16 0500 05/06/16 0553 05/06/16 0733 05/06/16 0800  BP:  (!) 157/96  (!) 160/83  Pulse:  85  78  Resp:  20  20  Temp:  98.6 F (37 C)  98 F (36.7 C)  TempSrc:  Oral  Oral  SpO2:  93% 90% 93%  Weight: 53.8 kg (118 lb 9.7 oz)     Height:       Weight change: 0.5 kg (1 lb 1.6 oz)  Physical Exam: General:alert Ox3, NAD   Heart: RRR No mur, rub, or gallop Lungs: Bilat Rhonchorus BS  With out Wheezes  Heard  Abdomen: soft , NT, ND Extremities:no pedal edema  Dialysis Access: R IJ P. Cath     OP Dialysis Prescription TTS [facility]  4h   TDC  52.5 (will be lower at dc)  2/2.5 bath Hep  4000 Aranesp 40/week last dosed 11/7 Venofer 50 /week (s/p 5 dose load)  Background: 65 y.o.year-old WF with COPD, ESRD (TTS [facility]), dCHF, h/o MI (per pt). [place] [facility]. Admitted with cough, SOB, wheezing. CXR with no edema or PNA, + small effusions. Viral panel + for rhinovirus. Treated as COPD flare  Problem/Plan:  1. ESRD - TTS [place]. HD on Schedule  Post HD weight Sat. 51.2 kg so will have lower EDW at discharge. 2. Rhinovirus + 3. COPD exacerbation -  On IV steroids/nebs. Improved clinically feels ok to be dc /Off O2 improved coughing / Plans per primary svce. 4. Secondary HPT - outpt calcitriol 0.75 TTS. PTH = 54 on 04/18/16 will hold Vit  D [facility]  Continue sevelamer. 5. CHF 6. H/O MI (per pt) 7. 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 Aranesp 40/week last dosed 11/7. Just finished outpt Fe load of 500 mg. Weekgly Fe on Thursdays now.  Lenny Pastelavid Zeyfang, PA-C [facility] Beeper [phone] 05/06/2016,8:36 AM  LOS: 3 days   Pt seen, examined and agree w A/P as above.  Vinson Moselleob Ezzie Senat MD [facility] pager [phone]   05/06/2016, 3:49 PM    Labs: Basic Metabolic Panel:  Recent Labs Lab  05/02/16 2047 05/03/16 0532 05/04/16 0556  NA 141 141 138  K 3.6 4.2 4.2  CL 100* 101 100*  CO2 31 29 25   GLUCOSE 106* 145* 149*  BUN 21* 31* 68*  CREATININE 3.69* 4.41* 5.76*  CALCIUM 8.7* 8.7* 8.6*  CBC:  Recent Labs Lab 05/02/16 2047 05/03/16 0532 05/04/16 0556  WBC 11.6* 9.5 12.2*  HGB 11.3* 11.2* 10.2*  HCT 35.5* 35.2* 31.3*  MCV 99.7 99.2 98.1  PLT 221 201 206   Cardiac Enzymes:  Recent Labs Lab 05/03/16 0532  TROPONINI <0.03    Studies/Results: No results found. Medications:  . amLODipine  5 mg Oral Once per day on Sun Mon Wed Fri   And  . amLODipine  5 mg Oral QHS  . azithromycin  500 mg Intravenous Daily  . calcitRIOL  0.75 mcg Oral Q T,Th,Sa-HD  . carvedilol  25 mg Oral Once per day on Tue Thu Sat   And  . carvedilol  25 mg Oral 2 times per day on Sun Mon Wed Fri  . cefTRIAXone (ROCEPHIN)  IV  1 g Intravenous Daily  . enoxaparin (LOVENOX) injection  30 mg Subcutaneous Daily  . guaiFENesin  600 mg Oral BID  . ipratropium-albuterol  3 mL Nebulization TID  . methylPREDNISolone (SOLU-MEDROL) injection  80 mg Intravenous Q8H  . sevelamer carbonate  800 mg Oral TID WC  . sodium chloride flush  3 mL Intravenous Q12H

## 2016-06-04 ENCOUNTER — Other Ambulatory Visit: Payer: Self-pay

## 2016-06-04 DIAGNOSIS — Z01810 Encounter for preprocedural cardiovascular examination: Secondary | ICD-10-CM

## 2016-06-04 DIAGNOSIS — N186 End stage renal disease: Secondary | ICD-10-CM

## 2016-07-01 ENCOUNTER — Encounter: Payer: Self-pay | Admitting: Vascular Surgery

## 2016-07-04 ENCOUNTER — Other Ambulatory Visit: Payer: Self-pay | Admitting: Surgery

## 2016-07-05 ENCOUNTER — Encounter: Payer: Self-pay | Admitting: Vascular Surgery

## 2016-07-05 ENCOUNTER — Other Ambulatory Visit: Payer: Self-pay

## 2016-07-05 ENCOUNTER — Ambulatory Visit (HOSPITAL_COMMUNITY)
Admission: RE | Admit: 2016-07-05 | Discharge: 2016-07-05 | Disposition: A | Discharge status: Home / Self Care | Payer: Medicare Other | Source: Ambulatory Visit | Attending: Vascular Surgery | Admitting: Vascular Surgery

## 2016-07-05 ENCOUNTER — Ambulatory Visit (INDEPENDENT_AMBULATORY_CARE_PROVIDER_SITE_OTHER): Payer: Medicare Other | Admitting: Vascular Surgery

## 2016-07-05 ENCOUNTER — Ambulatory Visit (INDEPENDENT_AMBULATORY_CARE_PROVIDER_SITE_OTHER)
Admission: RE | Admit: 2016-07-05 | Discharge: 2016-07-05 | Disposition: A | Discharge status: Home / Self Care | Payer: Medicare Other | Source: Ambulatory Visit | Attending: Vascular Surgery | Admitting: Vascular Surgery

## 2016-07-05 VITALS — BP 158/94 | HR 81 | Temp 98.0°F | Resp 18 | Ht 64.0 in | Wt 123.6 lb

## 2016-07-05 DIAGNOSIS — N186 End stage renal disease: Secondary | ICD-10-CM | POA: Diagnosis present

## 2016-07-05 DIAGNOSIS — Z992 Dependence on renal dialysis: Secondary | ICD-10-CM

## 2016-07-05 DIAGNOSIS — Z01810 Encounter for preprocedural cardiovascular examination: Secondary | ICD-10-CM

## 2016-07-05 NOTE — Progress Notes (Signed)
Patient ID: Nancy Macdonald, female   DOB: 10/30/1950, 66 y.o.   MRN: [id number]  Reason for Consult: New Evaluation (eval for perm access - Dr. Hyman Hopes - HD via cath TTS)   Referred by No ref. provider found  Subjective:     HPI:  Nancy Macdonald is a 66 y.o. female is here for evaluation of initial dialysis access. She is currently dialyzing via a right IJ tunneled dialysis catheter on Tuesdays Thursdays and Saturdays. She states her incision disease secondary to hypertension. She is nondiabetic and a former smoker having quit 1 year ago today. She does not have limitation to her walking no longer smokes she does not take blood thinners or aspirin. She is never had upper extremity surgery before. She is right-hand dominant.  Past Medical History:  Diagnosis Date  . CHF (congestive heart failure) (HCC)   . COPD (chronic obstructive pulmonary disease) (HCC)   . Coronary artery disease   . ESRD (end stage renal disease) on dialysis (HCC)   . Hypertension   . Pneumonia    Family History  Problem Relation Age of Onset  . Heart attack Mother   . Heart disease Mother    History reviewed. No pertinent surgical history.  Short Social History:  Social History  Substance Use Topics  . Smoking status: Former Smoker    Quit date: 07/06/2015  . Smokeless tobacco: Never Used  . Alcohol use No    No Known Allergies  Current Outpatient Prescriptions  Medication Sig Dispense Refill  . albuterol (PROVENTIL HFA;VENTOLIN HFA) 108 (90 Base) MCG/ACT inhaler Inhale 1-2 puffs into the lungs every 6 (six) hours as needed for wheezing or shortness of breath.    Marland Kitchen amLODipine (NORVASC) 5 MG tablet Take 5 mg by mouth See admin instructions. On Tuesday Thursday and Saturday only take once a day, all other days take twice daily.    . carvedilol (COREG) 25 MG tablet Take 25 mg by mouth See admin instructions. On Tuesday Thursday and Saturday only take once a day, all other days take twice daily.    .  sevelamer carbonate (RENVELA) 800 MG tablet Take 800 mg by mouth 3 (three) times daily with meals.    Marland Kitchen guaiFENesin (MUCINEX) 600 MG 12 hr tablet Take 1 tablet (600 mg total) by mouth 2 (two) times daily. (Patient not taking: Reported on 07/05/2016) 30 tablet 30  . levofloxacin (LEVAQUIN) 750 MG tablet Take 1 tablet (750 mg total) by mouth daily. (Patient not taking: Reported on 07/05/2016) 5 tablet 0  . predniSONE (STERAPRED UNI-PAK 21 TAB) 10 MG (21) TBPK tablet Take 6-5-4-3-2-1 Po daily till gone (Patient not taking: Reported on 07/05/2016) 21 tablet 0   No current facility-administered medications for this visit.     Review of Systems  Constitutional:  Constitutional negative. HENT: HENT negative.  Eyes: Eyes negative.  Respiratory: Respiratory negative.  Cardiovascular: Cardiovascular negative.  GI: Gastrointestinal negative.  Musculoskeletal: Musculoskeletal negative.  Skin: Skin negative.  Neurological: Neurological negative. Hematologic: Hematologic/lymphatic negative.  Psychiatric: Psychiatric negative.        Objective:  Objective   Vitals:   07/05/16 0856 07/05/16 0857  BP: (!) 161/89 (!) 158/94  Pulse: 81   Resp: 18   Temp: 98 F (36.7 C)   TempSrc: Oral   SpO2: 98%   Weight: 123 lb 9.6 oz (56.1 kg)   Height: 5\' 4"  (1.626 m)    Body mass index is 21.22 kg/m.  Physical Exam  Constitutional: She is  oriented to person, place, and time. She appears well-developed.  HENT:  Head: Atraumatic.  Eyes: Pupils are equal, round, and reactive to light.  Neck: Normal range of motion.  Cardiovascular: Normal rate.   Pulses:      Radial pulses are 2+ on the right side, and 2+ on the left side.  Pulmonary/Chest: Effort normal.  Abdominal: Soft. She exhibits no mass.  Musculoskeletal: Normal range of motion. She exhibits no edema.  Neurological: She is alert and oriented to person, place, and time.  Skin: Skin is warm and dry.  Psychiatric: She has a normal mood and  affect. Her behavior is normal. Judgment and thought content normal.    Data: Suitable basilic veins bilaterally for dialysis access. She also has brachial arteries that are 0.47 m bilaterally at the antecubital fossa.     Assessment/Plan:     66 year old white female incision renal disease on dialysis via right IJ tunneled dialysis catheter. We discussed the risk benefits of permanent access including steal, primary nonfunction, I am in, and injury to vessels or nerves during placement. She has a large basilic vein on the left side could possibly be done as a single stage basilic vein transposition versus 2 stages. We will schedule her today and all questions were answered.     Maeola HarmanBrandon Christopher Zev Blue MD [facility]

## 2016-07-19 ENCOUNTER — Encounter (HOSPITAL_COMMUNITY): Payer: Self-pay | Admitting: *Deleted

## 2016-07-21 NOTE — Anesthesia Preprocedure Evaluation (Addendum)
Anesthesia Evaluation  Patient identified by MRN, date of birth, ID band Patient awake    Reviewed: Allergy & Precautions, NPO status , Patient's Chart, lab work & pertinent test results, reviewed documented beta blocker date and time   Airway Mallampati: II  TM Distance: >3 FB Neck ROM: Full    Dental  (+) Dental Advisory Given   Pulmonary COPD, former smoker,    breath sounds clear to auscultation       Cardiovascular hypertension, Pt. on medications and Pt. on home beta blockers +CHF   Rhythm:Regular Rate:Normal     Neuro/Psych CVA    GI/Hepatic negative GI ROS, Neg liver ROS,   Endo/Other  negative endocrine ROS  Renal/GU ESRFRenal disease     Musculoskeletal   Abdominal   Peds  Hematology  (+) anemia ,   Anesthesia Other Findings   Reproductive/Obstetrics                            Lab Results  Component Value Date   WBC 12.2 (H) 05/04/2016   HGB 10.5 (L) 07/22/2016   HCT 31.0 (L) 07/22/2016   MCV 98.1 05/04/2016   PLT 206 05/04/2016   Lab Results  Component Value Date   CREATININE 5.76 (H) 05/04/2016   BUN 68 (H) 05/04/2016   NA 135 07/22/2016   K 4.6 07/22/2016   CL 100 (L) 05/04/2016   CO2 25 05/04/2016    Anesthesia Physical Anesthesia Plan  ASA: III  Anesthesia Plan: General   Post-op Pain Management:    Induction: Intravenous  Airway Management Planned: LMA  Additional Equipment:   Intra-op Plan:   Post-operative Plan: Extubation in OR  Informed Consent: I have reviewed the patients History and Physical, chart, labs and discussed the procedure including the risks, benefits and alternatives for the proposed anesthesia with the patient or authorized representative who has indicated his/her understanding and acceptance.   Dental advisory given  Plan Discussed with: CRNA  Anesthesia Plan Comments:        Anesthesia Quick Evaluation

## 2016-07-22 ENCOUNTER — Encounter (HOSPITAL_COMMUNITY)
Admission: RE | Disposition: A | Discharge status: Home / Self Care | Payer: Self-pay | Source: Ambulatory Visit | Attending: Vascular Surgery

## 2016-07-22 ENCOUNTER — Ambulatory Visit (HOSPITAL_COMMUNITY): Payer: Medicare Other | Admitting: Certified Registered Nurse Anesthetist

## 2016-07-22 ENCOUNTER — Ambulatory Visit (HOSPITAL_COMMUNITY)
Admission: RE | Admit: 2016-07-22 | Discharge: 2016-07-22 | Disposition: A | Discharge status: Home / Self Care | Payer: Medicare Other | Source: Ambulatory Visit | Attending: Vascular Surgery | Admitting: Vascular Surgery

## 2016-07-22 ENCOUNTER — Other Ambulatory Visit: Payer: Self-pay | Admitting: *Deleted

## 2016-07-22 ENCOUNTER — Encounter (HOSPITAL_COMMUNITY): Payer: Self-pay | Admitting: Certified Registered"

## 2016-07-22 DIAGNOSIS — N186 End stage renal disease: Secondary | ICD-10-CM | POA: Insufficient documentation

## 2016-07-22 DIAGNOSIS — Z79899 Other long term (current) drug therapy: Secondary | ICD-10-CM | POA: Diagnosis not present

## 2016-07-22 DIAGNOSIS — Z8249 Family history of ischemic heart disease and other diseases of the circulatory system: Secondary | ICD-10-CM | POA: Insufficient documentation

## 2016-07-22 DIAGNOSIS — N185 Chronic kidney disease, stage 5: Secondary | ICD-10-CM | POA: Diagnosis not present

## 2016-07-22 DIAGNOSIS — J449 Chronic obstructive pulmonary disease, unspecified: Secondary | ICD-10-CM | POA: Diagnosis not present

## 2016-07-22 DIAGNOSIS — Z87891 Personal history of nicotine dependence: Secondary | ICD-10-CM | POA: Diagnosis not present

## 2016-07-22 DIAGNOSIS — Z992 Dependence on renal dialysis: Secondary | ICD-10-CM | POA: Diagnosis not present

## 2016-07-22 DIAGNOSIS — I509 Heart failure, unspecified: Secondary | ICD-10-CM | POA: Diagnosis not present

## 2016-07-22 DIAGNOSIS — I132 Hypertensive heart and chronic kidney disease with heart failure and with stage 5 chronic kidney disease, or end stage renal disease: Secondary | ICD-10-CM | POA: Insufficient documentation

## 2016-07-22 DIAGNOSIS — D649 Anemia, unspecified: Secondary | ICD-10-CM | POA: Insufficient documentation

## 2016-07-22 DIAGNOSIS — Z4931 Encounter for adequacy testing for hemodialysis: Secondary | ICD-10-CM

## 2016-07-22 HISTORY — DX: Acute myocardial infarction, unspecified: I21.9

## 2016-07-22 HISTORY — PX: BASCILIC VEIN TRANSPOSITION: SHX5742

## 2016-07-22 HISTORY — DX: Unspecified visual loss: H54.7

## 2016-07-22 HISTORY — DX: Cerebral infarction, unspecified: I63.9

## 2016-07-22 HISTORY — DX: Unspecified osteoarthritis, unspecified site: M19.90

## 2016-07-22 HISTORY — DX: Anemia, unspecified: D64.9

## 2016-07-22 LAB — POCT I-STAT 4, (NA,K, GLUC, HGB,HCT)
GLUCOSE: 98 mg/dL (ref 65–99)
HEMATOCRIT: 31 % — AB (ref 36.0–46.0)
Hemoglobin: 10.5 g/dL — ABNORMAL LOW (ref 12.0–15.0)
Potassium: 4.6 mmol/L (ref 3.5–5.1)
Sodium: 135 mmol/L (ref 135–145)

## 2016-07-22 SURGERY — TRANSPOSITION, VEIN, BASILIC
Anesthesia: General | Site: Arm Upper | Laterality: Left

## 2016-07-22 MED ORDER — 0.9 % SODIUM CHLORIDE (POUR BTL) OPTIME
TOPICAL | Status: DC | PRN
  Administered 2016-07-22: 1000 mL

## 2016-07-22 MED ORDER — DEXTROSE 5 % IV SOLN
1.5000 g | INTRAVENOUS | Status: AC
  Administered 2016-07-22: 1.5 g via INTRAVENOUS
  Filled 2016-07-22: qty 1.5

## 2016-07-22 MED ORDER — ONDANSETRON HCL 4 MG/2ML IJ SOLN
INTRAMUSCULAR | Status: DC | PRN
  Administered 2016-07-22: 4 mg via INTRAVENOUS

## 2016-07-22 MED ORDER — HYDROMORPHONE HCL 1 MG/ML IJ SOLN
INTRAMUSCULAR | Status: AC
  Filled 2016-07-22: qty 0.5

## 2016-07-22 MED ORDER — LIDOCAINE-EPINEPHRINE (PF) 1 %-1:200000 IJ SOLN
INTRAMUSCULAR | Status: AC
  Filled 2016-07-22: qty 30

## 2016-07-22 MED ORDER — LIDOCAINE 2% (20 MG/ML) 5 ML SYRINGE
INTRAMUSCULAR | Status: AC
  Filled 2016-07-22: qty 5

## 2016-07-22 MED ORDER — FENTANYL CITRATE (PF) 100 MCG/2ML IJ SOLN
INTRAMUSCULAR | Status: AC
  Filled 2016-07-22: qty 2

## 2016-07-22 MED ORDER — PHENYLEPHRINE 40 MCG/ML (10ML) SYRINGE FOR IV PUSH (FOR BLOOD PRESSURE SUPPORT)
PREFILLED_SYRINGE | INTRAVENOUS | Status: AC
  Filled 2016-07-22: qty 10

## 2016-07-22 MED ORDER — PROPOFOL 10 MG/ML IV BOLUS
INTRAVENOUS | Status: DC | PRN
  Administered 2016-07-22: 50 mg via INTRAVENOUS
  Administered 2016-07-22: 150 mg via INTRAVENOUS

## 2016-07-22 MED ORDER — EPHEDRINE 5 MG/ML INJ
INTRAVENOUS | Status: AC
  Filled 2016-07-22: qty 10

## 2016-07-22 MED ORDER — SODIUM CHLORIDE 0.9 % IV SOLN
INTRAVENOUS | Status: DC
  Administered 2016-07-22: 07:00:00 via INTRAVENOUS

## 2016-07-22 MED ORDER — FENTANYL CITRATE (PF) 100 MCG/2ML IJ SOLN
INTRAMUSCULAR | Status: DC | PRN
  Administered 2016-07-22 (×4): 25 ug via INTRAVENOUS

## 2016-07-22 MED ORDER — EPHEDRINE SULFATE-NACL 50-0.9 MG/10ML-% IV SOSY
PREFILLED_SYRINGE | INTRAVENOUS | Status: DC | PRN
  Administered 2016-07-22: 10 mg via INTRAVENOUS

## 2016-07-22 MED ORDER — SODIUM CHLORIDE 0.9 % IV SOLN
INTRAVENOUS | Status: DC | PRN
  Administered 2016-07-22: 08:00:00

## 2016-07-22 MED ORDER — CHLORHEXIDINE GLUCONATE CLOTH 2 % EX PADS: 6.0000 | MEDICATED_PAD | Freq: Once | CUTANEOUS | Status: DC

## 2016-07-22 MED ORDER — OXYCODONE-ACETAMINOPHEN 5-325 MG PO TABS: 1.0000 | ORAL_TABLET | Freq: Four times a day (QID) | ORAL | 0 refills | Status: DC | PRN

## 2016-07-22 MED ORDER — LIDOCAINE 2% (20 MG/ML) 5 ML SYRINGE
INTRAMUSCULAR | Status: DC | PRN
  Administered 2016-07-22: 50 mg via INTRAVENOUS

## 2016-07-22 MED ORDER — PROMETHAZINE HCL 25 MG/ML IJ SOLN: 6.2500 mg | INTRAMUSCULAR | Status: DC | PRN

## 2016-07-22 MED ORDER — PROPOFOL 10 MG/ML IV BOLUS
INTRAVENOUS | Status: AC
  Filled 2016-07-22: qty 20

## 2016-07-22 MED ORDER — HYDROMORPHONE HCL 1 MG/ML IJ SOLN
0.2500 mg | INTRAMUSCULAR | Status: DC | PRN
  Administered 2016-07-22: 0.5 mg via INTRAVENOUS

## 2016-07-22 MED ORDER — ONDANSETRON HCL 4 MG/2ML IJ SOLN
INTRAMUSCULAR | Status: AC
  Filled 2016-07-22: qty 2

## 2016-07-22 SURGICAL SUPPLY — 37 items
ARMBAND PINK RESTRICT EXTREMIT (MISCELLANEOUS) ×3 IMPLANT
CANISTER SUCTION 2500CC (MISCELLANEOUS) ×3 IMPLANT
CLIP TI MEDIUM 24 (CLIP) ×3 IMPLANT
CLIP TI MEDIUM 6 (CLIP) ×3 IMPLANT
CLIP TI WIDE RED SMALL 24 (CLIP) ×3 IMPLANT
CLIP TI WIDE RED SMALL 6 (CLIP) ×3 IMPLANT
COVER PROBE W GEL 5X96 (DRAPES) ×3 IMPLANT
DERMABOND ADVANCED (GAUZE/BANDAGES/DRESSINGS) ×2
DERMABOND ADVANCED .7 DNX12 (GAUZE/BANDAGES/DRESSINGS) ×1 IMPLANT
ELECT REM PT RETURN 9FT ADLT (ELECTROSURGICAL) ×3
ELECTRODE REM PT RTRN 9FT ADLT (ELECTROSURGICAL) ×1 IMPLANT
GLOVE BIO SURGEON STRL SZ 6.5 (GLOVE) ×2 IMPLANT
GLOVE BIO SURGEON STRL SZ7.5 (GLOVE) ×3 IMPLANT
GLOVE BIO SURGEONS STRL SZ 6.5 (GLOVE) ×1
GLOVE BIOGEL PI IND STRL 6.5 (GLOVE) ×2 IMPLANT
GLOVE BIOGEL PI IND STRL 7.0 (GLOVE) ×1 IMPLANT
GLOVE BIOGEL PI IND STRL 7.5 (GLOVE) ×1 IMPLANT
GLOVE BIOGEL PI INDICATOR 6.5 (GLOVE) ×4
GLOVE BIOGEL PI INDICATOR 7.0 (GLOVE) ×2
GLOVE BIOGEL PI INDICATOR 7.5 (GLOVE) ×2
GLOVE ECLIPSE 6.5 STRL STRAW (GLOVE) ×3 IMPLANT
GOWN STRL REUS W/ TWL LRG LVL3 (GOWN DISPOSABLE) ×3 IMPLANT
GOWN STRL REUS W/ TWL XL LVL3 (GOWN DISPOSABLE) ×1 IMPLANT
GOWN STRL REUS W/TWL LRG LVL3 (GOWN DISPOSABLE) ×6
GOWN STRL REUS W/TWL XL LVL3 (GOWN DISPOSABLE) ×2
KIT BASIN OR (CUSTOM PROCEDURE TRAY) ×3 IMPLANT
KIT ROOM TURNOVER OR (KITS) ×3 IMPLANT
NS IRRIG 1000ML POUR BTL (IV SOLUTION) ×3 IMPLANT
PACK CV ACCESS (CUSTOM PROCEDURE TRAY) ×3 IMPLANT
PAD ARMBOARD 7.5X6 YLW CONV (MISCELLANEOUS) ×6 IMPLANT
SUT MNCRL AB 4-0 PS2 18 (SUTURE) ×3 IMPLANT
SUT PROLENE 6 0 BV (SUTURE) ×6 IMPLANT
SUT SILK 2 0 SH (SUTURE) IMPLANT
SUT VIC AB 3-0 SH 27 (SUTURE) ×2
SUT VIC AB 3-0 SH 27X BRD (SUTURE) ×1 IMPLANT
UNDERPAD 30X30 (UNDERPADS AND DIAPERS) ×3 IMPLANT
WATER STERILE IRR 1000ML POUR (IV SOLUTION) ×3 IMPLANT

## 2016-07-22 NOTE — Anesthesia Procedure Notes (Signed)
Procedure Name: LMA Insertion Date/Time: 07/22/2016 7:34 AM Performed by: Lucinda DellECARLO, Tilak Oakley M Pre-anesthesia Checklist: Patient identified, Emergency Drugs available, Patient being monitored and Suction available Patient Re-evaluated:Patient Re-evaluated prior to inductionOxygen Delivery Method: Circle system utilized Preoxygenation: Pre-oxygenation with 100% oxygen Intubation Type: IV induction Ventilation: Mask ventilation without difficulty LMA: LMA inserted LMA Size: 4.0 Tube type: Oral Number of attempts: 1 Placement Confirmation: positive ETCO2 and breath sounds checked- equal and bilateral Tube secured with: Tape Dental Injury: Teeth and Oropharynx as per pre-operative assessment

## 2016-07-22 NOTE — Anesthesia Postprocedure Evaluation (Signed)
Anesthesia Post Note  Patient: Nancy Macdonald  Procedure(s) Performed: Procedure(s) (LRB): FIRST STAGE BASILIC VEIN TRANSPOSITION LEFT UPPER ARM (Left)  Patient location during evaluation: PACU Anesthesia Type: General Level of consciousness: awake and alert Pain management: pain level controlled Vital Signs Assessment: post-procedure vital signs reviewed and stable Respiratory status: spontaneous breathing, nonlabored ventilation, respiratory function stable and patient connected to nasal cannula oxygen Cardiovascular status: blood pressure returned to baseline and stable Postop Assessment: no signs of nausea or vomiting Anesthetic complications: no       Last Vitals:  Vitals:   07/22/16 0910 07/22/16 0922  BP: 134/76 132/81  Pulse: 71 83  Resp: (!) 22 20  Temp:      Last Pain:  Vitals:   07/22/16 0910  TempSrc:   PainSc: 4                  Kennieth RadFitzgerald, [name]

## 2016-07-22 NOTE — H&P (Signed)
     History and Physical Update  The patient was interviewed and re-examined.  The patient's previous History and Physical has been reviewed and is unchanged from office visit. Plan left arm basilic vein transposition fistula.    Nancy Mclouth C. Randie Heinzain, MD [facility] [place] Office: [phone] Pager: [phone]   07/22/2016, 7:22 AM

## 2016-07-22 NOTE — Op Note (Signed)
    OPERATIVE NOTE   PROCEDURE: Left arm 1st stage basilic vein transposition av fistula  PRE-OPERATIVE DIAGNOSIS: esrd  POST-OPERATIVE DIAGNOSIS: same  SURGEON: Brandon C. Randie Heinzain, MD  ASSISTANT: RNFA  ANESTHESIA: general  ESTIMATED BLOOD LOSS: 20 cc  FINDING(S): Large caliber basilic vein dilated to 3.415mm, artery without disease and 4mm external diameter  INDICATIONS:   Nancy Macdonald is a 66 y.o. female who presents with esrd. She has been consented for a left arm basilic vein transposition avf with risks of primary non-function, steal, imn and need for possible staged operation and future interventions to maintain patency.  DESCRIPTION: After full informed written consent was obtained from the patient, she was taking operating room placed supine on the table general anesthesia was induced she was given antibiotics and sterilely prepped and draped for left arm fistula or seizure. Timeout was called. We used ultrasound to identify her basilic vein and her brachial artery. Transverse incision was then made below the antecubitum. First dissected out the vein which is noted to be large there was a median cubital attaching to the cephalic vein. I divided the vein off of this allowing direct flow through the cephalic vein to the upper arm. Vein was then dilated to 3.5 mm flushed with heparinized saline and clamped. Dissected out the artery right at the trifurcation. Placed vessel loops around proximal distal as well as the interosseous branch which wasquite large. We then clamped all the branches opened longitudinally flushed with heparinized saline. The vein was then sewn end to side with 6-0 Prolene suture. Prior to completing our anastomosis we allowed flushing maneuvers. Anastomosis was completed there was noted to be a palpable thrill in the runoff vein as well as popliteal radial pulse at the left wrist. Hemostasis obtained was irrigated we closed in layers of Vicryl Monocryl at the level  of the skin and placed Dermabond above that. Patient tolerated the procedure well without immediate competition.  COMPLICATIONS: none immediate  CONDITION: stable  Brandon C. Randie Heinzain, MD [facility] [place] Office: [phone] Pager: [phone]  07/22/2016, 8:45 AM

## 2016-07-22 NOTE — Transfer of Care (Signed)
Immediate Anesthesia Transfer of Care Note  Patient: Orlinda Blalockarrie L Soule  Procedure(s) Performed: Procedure(s): FIRST STAGE BASILIC VEIN TRANSPOSITION LEFT UPPER ARM (Left)  Patient Location: PACU  Anesthesia Type:General  Level of Consciousness: awake, alert , oriented and patient cooperative  Airway & Oxygen Therapy: Patient Spontanous Breathing and Patient connected to nasal cannula oxygen  Post-op Assessment: Report given to RN, Post -op Vital signs reviewed and stable and Patient moving all extremities  Post vital signs: Reviewed and stable  Last Vitals:  Vitals:   07/22/16 0630 07/22/16 0839  BP: 130/77 (!) 151/84  Pulse: 68 76  Resp: 18 15  Temp: 36.4 C     Last Pain:  Vitals:   07/22/16 0630  TempSrc: Oral      Patients Stated Pain Goal: 8 (07/22/16 0704)  Complications: No apparent anesthesia complications

## 2016-07-23 ENCOUNTER — Encounter (HOSPITAL_COMMUNITY): Payer: Self-pay | Admitting: Vascular Surgery

## 2016-09-02 ENCOUNTER — Encounter: Payer: Self-pay | Admitting: Vascular Surgery

## 2016-09-13 ENCOUNTER — Ambulatory Visit (HOSPITAL_COMMUNITY): Payer: Medicare Other

## 2016-09-13 ENCOUNTER — Encounter: Payer: Medicare Other | Admitting: Vascular Surgery

## 2016-10-17 ENCOUNTER — Encounter (HOSPITAL_COMMUNITY): Payer: Self-pay | Admitting: General Practice

## 2016-10-17 ENCOUNTER — Emergency Department (HOSPITAL_COMMUNITY): Payer: Medicare Other

## 2016-10-17 ENCOUNTER — Other Ambulatory Visit: Payer: Self-pay

## 2016-10-17 ENCOUNTER — Other Ambulatory Visit (HOSPITAL_COMMUNITY): Payer: Self-pay

## 2016-10-17 ENCOUNTER — Observation Stay (HOSPITAL_COMMUNITY)
Admission: EM | Admit: 2016-10-17 | Discharge: 2016-10-18 | Disposition: A | Discharge status: Home / Self Care | Payer: Medicare Other | Attending: Internal Medicine | Admitting: Internal Medicine

## 2016-10-17 ENCOUNTER — Observation Stay (HOSPITAL_COMMUNITY): Payer: Medicare Other

## 2016-10-17 DIAGNOSIS — R0602 Shortness of breath: Secondary | ICD-10-CM

## 2016-10-17 DIAGNOSIS — I132 Hypertensive heart and chronic kidney disease with heart failure and with stage 5 chronic kidney disease, or end stage renal disease: Secondary | ICD-10-CM | POA: Diagnosis not present

## 2016-10-17 DIAGNOSIS — R079 Chest pain, unspecified: Secondary | ICD-10-CM | POA: Diagnosis not present

## 2016-10-17 DIAGNOSIS — D631 Anemia in chronic kidney disease: Secondary | ICD-10-CM | POA: Diagnosis present

## 2016-10-17 DIAGNOSIS — Z87891 Personal history of nicotine dependence: Secondary | ICD-10-CM | POA: Insufficient documentation

## 2016-10-17 DIAGNOSIS — I2699 Other pulmonary embolism without acute cor pulmonale: Secondary | ICD-10-CM | POA: Diagnosis not present

## 2016-10-17 DIAGNOSIS — I10 Essential (primary) hypertension: Secondary | ICD-10-CM | POA: Diagnosis present

## 2016-10-17 DIAGNOSIS — N250 Renal osteodystrophy: Secondary | ICD-10-CM | POA: Diagnosis present

## 2016-10-17 DIAGNOSIS — J449 Chronic obstructive pulmonary disease, unspecified: Secondary | ICD-10-CM | POA: Diagnosis not present

## 2016-10-17 DIAGNOSIS — Z820 Family history of epilepsy and other diseases of the nervous system: Secondary | ICD-10-CM

## 2016-10-17 DIAGNOSIS — I252 Old myocardial infarction: Secondary | ICD-10-CM | POA: Diagnosis not present

## 2016-10-17 DIAGNOSIS — N189 Chronic kidney disease, unspecified: Secondary | ICD-10-CM

## 2016-10-17 DIAGNOSIS — I2510 Atherosclerotic heart disease of native coronary artery without angina pectoris: Secondary | ICD-10-CM | POA: Diagnosis not present

## 2016-10-17 DIAGNOSIS — Z992 Dependence on renal dialysis: Secondary | ICD-10-CM

## 2016-10-17 DIAGNOSIS — Z8249 Family history of ischemic heart disease and other diseases of the circulatory system: Secondary | ICD-10-CM

## 2016-10-17 DIAGNOSIS — R091 Pleurisy: Secondary | ICD-10-CM

## 2016-10-17 DIAGNOSIS — N186 End stage renal disease: Secondary | ICD-10-CM | POA: Diagnosis not present

## 2016-10-17 DIAGNOSIS — Z794 Long term (current) use of insulin: Secondary | ICD-10-CM

## 2016-10-17 DIAGNOSIS — Z8673 Personal history of transient ischemic attack (TIA), and cerebral infarction without residual deficits: Secondary | ICD-10-CM | POA: Insufficient documentation

## 2016-10-17 DIAGNOSIS — I5032 Chronic diastolic (congestive) heart failure: Secondary | ICD-10-CM | POA: Diagnosis present

## 2016-10-17 DIAGNOSIS — R0600 Dyspnea, unspecified: Secondary | ICD-10-CM

## 2016-10-17 HISTORY — DX: Disorder of kidney and ureter, unspecified: N28.9

## 2016-10-17 LAB — CBC
HCT: 32.2 % — ABNORMAL LOW (ref 36.0–46.0)
Hemoglobin: 10.6 g/dL — ABNORMAL LOW (ref 12.0–15.0)
MCH: 32.3 pg (ref 26.0–34.0)
MCHC: 32.9 g/dL (ref 30.0–36.0)
MCV: 98.2 fL (ref 78.0–100.0)
Platelets: 199 10*3/uL (ref 150–400)
RBC: 3.28 MIL/uL — ABNORMAL LOW (ref 3.87–5.11)
RDW: 14.9 % (ref 11.5–15.5)
WBC: 10.1 10*3/uL (ref 4.0–10.5)

## 2016-10-17 LAB — I-STAT CHEM 8, ED
BUN: 70 mg/dL — ABNORMAL HIGH (ref 6–20)
CALCIUM ION: 0.92 mmol/L — AB (ref 1.15–1.40)
CREATININE: 8.4 mg/dL — AB (ref 0.44–1.00)
Chloride: 95 mmol/L — ABNORMAL LOW (ref 101–111)
GLUCOSE: 98 mg/dL (ref 65–99)
HCT: 30 % — ABNORMAL LOW (ref 36.0–46.0)
HEMOGLOBIN: 10.2 g/dL — AB (ref 12.0–15.0)
Potassium: 4.9 mmol/L (ref 3.5–5.1)
Sodium: 136 mmol/L (ref 135–145)
TCO2: 31 mmol/L (ref 0–100)

## 2016-10-17 LAB — RENAL FUNCTION PANEL
Albumin: 3.2 g/dL — ABNORMAL LOW (ref 3.5–5.0)
Anion gap: 15 (ref 5–15)
BUN: 70 mg/dL — ABNORMAL HIGH (ref 6–20)
CO2: 27 mmol/L (ref 22–32)
Calcium: 8.1 mg/dL — ABNORMAL LOW (ref 8.9–10.3)
Chloride: 94 mmol/L — ABNORMAL LOW (ref 101–111)
Creatinine, Ser: 8.47 mg/dL — ABNORMAL HIGH (ref 0.44–1.00)
GFR calc Af Amer: 5 mL/min — ABNORMAL LOW (ref >60)
GFR calc non Af Amer: 4 mL/min — ABNORMAL LOW (ref >60)
Glucose, Bld: 87 mg/dL (ref 65–99)
Phosphorus: 6 mg/dL — ABNORMAL HIGH (ref 2.5–4.6)
Potassium: 4.6 mmol/L (ref 3.5–5.1)
Sodium: 136 mmol/L (ref 135–145)

## 2016-10-17 LAB — TROPONIN I
Troponin I: <0.03 ng/mL (ref <0.03)
Troponin I: <0.03 ng/mL (ref <0.03)

## 2016-10-17 LAB — MRSA PCR SCREENING: MRSA BY PCR: NEGATIVE

## 2016-10-17 LAB — CBC WITH DIFFERENTIAL/PLATELET
BASOS PCT: 0 %
Basophils Absolute: 0 10*3/uL (ref 0.0–0.1)
EOS ABS: 0.2 10*3/uL (ref 0.0–0.7)
EOS PCT: 2 %
HCT: 30.9 % — ABNORMAL LOW (ref 36.0–46.0)
HEMOGLOBIN: 10 g/dL — AB (ref 12.0–15.0)
Lymphocytes Relative: 16 %
Lymphs Abs: 1.7 10*3/uL (ref 0.7–4.0)
MCH: 31.9 pg (ref 26.0–34.0)
MCHC: 32.4 g/dL (ref 30.0–36.0)
MCV: 98.7 fL (ref 78.0–100.0)
MONO ABS: 0.9 10*3/uL (ref 0.1–1.0)
MONOS PCT: 8 %
NEUTROS PCT: 74 %
Neutro Abs: 7.8 10*3/uL — ABNORMAL HIGH (ref 1.7–7.7)
PLATELETS: 192 10*3/uL (ref 150–400)
RBC: 3.13 MIL/uL — ABNORMAL LOW (ref 3.87–5.11)
RDW: 15 % (ref 11.5–15.5)
WBC: 10.6 10*3/uL — ABNORMAL HIGH (ref 4.0–10.5)

## 2016-10-17 LAB — I-STAT TROPONIN, ED: TROPONIN I, POC: 0 ng/mL (ref 0.00–0.08)

## 2016-10-17 LAB — D-DIMER, QUANTITATIVE (NOT AT ARMC): D DIMER QUANT: 2.21 ug{FEU}/mL — AB (ref 0.00–0.50)

## 2016-10-17 MED ORDER — LIDOCAINE HCL (PF) 1 % IJ SOLN
5.0000 mL | INTRAMUSCULAR | Status: DC | PRN
  Filled 2016-10-17: qty 5

## 2016-10-17 MED ORDER — HEPARIN SODIUM (PORCINE) 5000 UNIT/ML IJ SOLN
5000.0000 [IU] | Freq: Three times a day (TID) | INTRAMUSCULAR | Status: DC
  Administered 2016-10-17 – 2016-10-18 (×3): 5000 [IU] via SUBCUTANEOUS
  Filled 2016-10-17 (×3): qty 1

## 2016-10-17 MED ORDER — AMLODIPINE BESYLATE 5 MG PO TABS
5.0000 mg | ORAL_TABLET | Freq: Every day | ORAL | Status: DC
  Administered 2016-10-17: 5 mg via ORAL
  Filled 2016-10-17 (×2): qty 1

## 2016-10-17 MED ORDER — SODIUM CHLORIDE 0.9 % IV SOLN: 100.0000 mL | INTRAVENOUS | Status: DC | PRN

## 2016-10-17 MED ORDER — ALBUTEROL SULFATE (2.5 MG/3ML) 0.083% IN NEBU: 2.5000 mg | INHALATION_SOLUTION | RESPIRATORY_TRACT | Status: DC | PRN

## 2016-10-17 MED ORDER — IPRATROPIUM-ALBUTEROL 0.5-2.5 (3) MG/3ML IN SOLN: 3.0000 mL | Freq: Four times a day (QID) | RESPIRATORY_TRACT | Status: DC | PRN

## 2016-10-17 MED ORDER — CARVEDILOL 25 MG PO TABS
25.0000 mg | ORAL_TABLET | Freq: Two times a day (BID) | ORAL | Status: DC
  Administered 2016-10-17 (×2): 25 mg via ORAL
  Filled 2016-10-17 (×3): qty 1

## 2016-10-17 MED ORDER — LIDOCAINE-PRILOCAINE 2.5-2.5 % EX CREA
1.0000 | TOPICAL_CREAM | CUTANEOUS | Status: DC | PRN
Start: 2016-10-17 — End: 2016-10-18

## 2016-10-17 MED ORDER — NITROGLYCERIN 0.4 MG SL SUBL
0.4000 mg | SUBLINGUAL_TABLET | Freq: Once | SUBLINGUAL | Status: AC
  Administered 2016-10-17: 0.4 mg via SUBLINGUAL
  Filled 2016-10-17: qty 1

## 2016-10-17 MED ORDER — HEPARIN SODIUM (PORCINE) 1000 UNIT/ML DIALYSIS
1000.0000 [IU] | INTRAMUSCULAR | Status: DC | PRN
  Filled 2016-10-17: qty 1

## 2016-10-17 MED ORDER — DICLOFENAC SODIUM 1 % TD GEL
4.0000 g | Freq: Four times a day (QID) | TRANSDERMAL | Status: DC
  Administered 2016-10-18: 4 g via TOPICAL
  Filled 2016-10-17 (×2): qty 100

## 2016-10-17 MED ORDER — IOPAMIDOL (ISOVUE-370) INJECTION 76%
INTRAVENOUS | Status: AC
  Administered 2016-10-17: 100 mL
  Filled 2016-10-17: qty 100

## 2016-10-17 MED ORDER — HEPARIN SODIUM (PORCINE) 1000 UNIT/ML DIALYSIS
4000.0000 [IU] | Freq: Once | INTRAMUSCULAR | Status: DC
  Filled 2016-10-17: qty 4

## 2016-10-17 MED ORDER — SEVELAMER CARBONATE 800 MG PO TABS
1600.0000 mg | ORAL_TABLET | Freq: Three times a day (TID) | ORAL | Status: DC
  Administered 2016-10-17 – 2016-10-18 (×2): 1600 mg via ORAL
  Filled 2016-10-17 (×2): qty 2

## 2016-10-17 MED ORDER — ASPIRIN EC 325 MG PO TBEC
325.0000 mg | DELAYED_RELEASE_TABLET | Freq: Every day | ORAL | Status: DC
  Administered 2016-10-17 – 2016-10-18 (×2): 325 mg via ORAL
  Filled 2016-10-17 (×2): qty 1

## 2016-10-17 MED ORDER — PENTAFLUOROPROP-TETRAFLUOROETH EX AERO: 1.0000 "application " | INHALATION_SPRAY | CUTANEOUS | Status: DC | PRN

## 2016-10-17 MED ORDER — ALTEPLASE 2 MG IJ SOLR: 2.0000 mg | Freq: Once | INTRAMUSCULAR | Status: DC | PRN

## 2016-10-17 MED ORDER — SODIUM CHLORIDE 0.9% FLUSH
3.0000 mL | Freq: Two times a day (BID) | INTRAVENOUS | Status: DC
  Administered 2016-10-17 – 2016-10-18 (×3): 3 mL via INTRAVENOUS

## 2016-10-17 MED ORDER — IPRATROPIUM-ALBUTEROL 0.5-2.5 (3) MG/3ML IN SOLN
3.0000 mL | Freq: Once | RESPIRATORY_TRACT | Status: AC
  Administered 2016-10-17: 3 mL via RESPIRATORY_TRACT
  Filled 2016-10-17: qty 3

## 2016-10-17 NOTE — ED Triage Notes (Signed)
Ems states patient c/o sob onset 3am states she walked to the bathroom and was barely able to get back to her bed. Patient is a dialysis patient and due for dialysis this am, ems states transport to dialysis was actally there when they arrived. Patient states she has been under a lot of stress at home. States she has a "breathing machine at home" however hasn't had the medicine for it since Sept. Per ems b/p-140/80, p-94 r-20 sat-98% RA, CBG - 136

## 2016-10-17 NOTE — Progress Notes (Signed)
Nancy Macdonald is a 66 Y/O female with ESRD on HD  T,Th,S at [facility]. PMH of HTN, CAD/MI 2015 COPD history of tobacco abuse. Patient presents to ED after awakening with chest pain this AM around 0300. She describes pain as sharp 7/10 worse when she takes a deep breath. Localizes to 5th ICS LSA. Denies N,V, diaphoresis, says that she did have increased SOB. Says she did have cough after she left HD unit Tuesday, no fever, chill, malaise.  Says "I tried to ride it out" but around 0500 she called daughter and was brought to ED for evaluation. Afebrile on adm, VS stable. EKG SR with prolonged QTc otherwise unremarkable. CXR unremarkable. 1st troponin negative. K+ 4.9 D Dimer ordered. She is being admitted as observation patient for chest pain. Currently she appears stable in NAD.  She did had HD 10/15/16 stayed for full treatment, left 1.2 above EDW.Today is her regular dialysis day. Will put in orders for HD. Please notify us if she is upgraded to in patient status and we will consult formally.   HD Orders: [facility] TThS 4 hrs 400/Auto 1.5 56 Kg 2.0 K/2.25 Ca UF profile 2 HD access: RIJ TDC Drsg CDI Maturing LUA AVF + bruit -Heparin 4000 units IV TIW -Calcitriol 0.5 mcg PO TIW (PTH 208 09/19/16) -Mircera 30 mcg IV q 2 weeks (Last dose 10/15/16 Last HGB 10)   Alonna Buckler AGNP-C [facility] [phone] (Pager)

## 2016-10-17 NOTE — ED Provider Notes (Signed)
[facility]-EMERGENCY DEPT Provider Note   CSN: [id number] Arrival date & time: 10/17/16  2130     History   Chief Complaint Chief Complaint  Patient presents with  . Shortness of Breath  . Chest Pain    HPI Nancy Macdonald is a 66 y.o. female.  HPI Patient presents for shortness of breath. Began around 3 in the morning. States she was much more dyspneic and was unable to walk to the bathroom like she normally be able to. Does have a history of COPD CHF and is on dialysis. States she's been out of her nebulizer medicine for a while. Occasional cough. No fevers. No abdominal pain. She was dialyzed on Tuesday and is due for dialysis this morning. Patient states that she also began to have chest pain. It was dull in her left chest. States it does feel like her previous heart pain. No nausea. No diaphoresis.   Past Medical History:  Diagnosis Date  . Anemia   . Arthritis   . Blind    left eye  . CHF (congestive heart failure) (HCC)   . COPD (chronic obstructive pulmonary disease) (HCC)   . Coronary artery disease   . ESRD (end stage renal disease) on dialysis (HCC)   . Hypertension   . Myocardial infarction   . Pneumonia   . Stroke [facility])    ? mini stroke-     Patient Active Problem List   Diagnosis Date Noted  . COPD with exacerbation (HCC) 05/03/2016  . Chronic diastolic CHF (congestive heart failure) (HCC) 05/03/2016  . ESRD (end stage renal disease) on dialysis (HCC) 05/03/2016  . Essential hypertension 05/03/2016  . Anemia of chronic renal failure, stage 5 (HCC) 05/03/2016  . COPD exacerbation (HCC) 05/03/2016    Past Surgical History:  Procedure Laterality Date  . BASCILIC VEIN TRANSPOSITION Left 07/22/2016   Procedure: FIRST STAGE BASILIC VEIN TRANSPOSITION LEFT UPPER ARM;  Surgeon: Maeola Harman, MD;  Location: [facility] OR;  Service: Vascular;  Laterality: Left;  . CESAREAN SECTION      OB History    No data available       Home Medications    Prior  to Admission medications   Medication Sig Start Date End Date Taking? Authorizing Provider  amLODipine (NORVASC) 5 MG tablet Take 5 mg by mouth at bedtime.     Historical Provider, MD  carvedilol (COREG) 25 MG tablet Take 25 mg by mouth 2 (two) times daily.     Historical Provider, MD  oxyCODONE-acetaminophen (PERCOCET/ROXICET) 5-325 MG tablet Take 1 tablet by mouth every 6 (six) hours as needed. 07/22/16   Raymond Gurney, PA-C  sevelamer carbonate (RENVELA) 800 MG tablet Take 1,600 mg by mouth 3 (three) times daily with meals.     Historical Provider, MD    Family History Family History  Problem Relation Age of Onset  . Heart attack Mother   . Heart disease Mother     Social History Social History  Substance Use Topics  . Smoking status: Former Smoker    Quit date: 07/06/2015  . Smokeless tobacco: Never Used  . Alcohol use No     Allergies   Patient has no known allergies.   Review of Systems Review of Systems  Constitutional: Negative for appetite change.  HENT: Negative for congestion.   Respiratory: Positive for cough and shortness of breath.   Cardiovascular: Positive for chest pain.  Gastrointestinal: Negative for abdominal pain.  Genitourinary: Negative for dysuria.  Musculoskeletal: Negative  for back pain.  Skin: Negative for wound.  Neurological: Negative for weakness.  Hematological: Negative for adenopathy.  Psychiatric/Behavioral: Negative for confusion.     Physical Exam Updated Vital Signs BP 131/65 (BP Location: Right Arm)   Pulse 93   Temp 98.3 F (36.8 C) (Oral)   Resp 20   Ht  (1.626 m)   Wt 125 lb (56.7 kg)   SpO2 97%   BMI 21.46 kg/m   Physical Exam  Constitutional: She is oriented to person, place, and time. She appears well-developed.  HENT:  Head: Atraumatic.  Eyes: Pupils are equal, round, and reactive to light.  Cardiovascular: Normal rate.   Pulmonary/Chest: Effort normal.  No frank wheezes. May have few rales at the  bases. Dialysis catheter to right chest wall.  Abdominal: Soft. There is no tenderness.  Musculoskeletal: She exhibits no edema.  Neurological: She is alert and oriented to person, place, and time.  Skin: Skin is warm. Capillary refill takes less than 2 seconds.  Psychiatric: She has a normal mood and affect.     ED Treatments / Results  Labs (all labs ordered are listed, but only abnormal results are displayed) Labs Reviewed  CBC WITH DIFFERENTIAL/PLATELET - Abnormal; Notable for the following:       Result Value   WBC 10.6 (*)    RBC 3.13 (*)    Hemoglobin 10.0 (*)    HCT 30.9 (*)    Neutro Abs 7.8 (*)    All other components within normal limits  I-STAT CHEM 8, ED - Abnormal; Notable for the following:    Chloride 95 (*)    BUN 70 (*)    Creatinine, Ser 8.40 (*)    Calcium, Ion 0.92 (*)    Hemoglobin 10.2 (*)    HCT 30.0 (*)    All other components within normal limits  I-STAT TROPOININ, ED    EKG  EKG Interpretation  Date/Time:  Thursday October 17 2016 07:22:33 EDT Ventricular Rate:  92 PR Interval:    QRS Duration: 96 QT Interval:  412 QTC Calculation: 510 R Axis:   53 Text Interpretation:  Sinus rhythm Prolonged QT interval Confirmed by Rubin Payor  MD, Renelle Stegenga [phone]) on 10/17/2016 8:33:01 AM       Radiology Dg Chest Portable 1 View  Result Date: 10/17/2016 CLINICAL DATA:  Chest pain and shortness of Breath EXAM: PORTABLE CHEST 1 VIEW COMPARISON:  05/02/2016 FINDINGS: Cardiac shadow is within normal limits. Right jugular dialysis catheter is again seen and stable. The lungs are well aerated bilaterally. Chronic blunting of left costophrenic angle is seen. No acute infiltrate or sizable effusion is noted. No bony abnormality is seen. IMPRESSION: Chronic changes without acute abnormality. Electronically Signed   By: Alcide Clever M.D.   On: 10/17/2016 07:53    Procedures Procedures (including critical care time)  Medications Ordered in ED Medications    ipratropium-albuterol (DUONEB) 0.5-2.5 (3) MG/3ML nebulizer solution 3 mL (3 mLs Nebulization Given 10/17/16 0736)  nitroGLYCERIN (NITROSTAT) SL tablet 0.4 mg (0.4 mg Sublingual Given 10/17/16 0736)     Initial Impression / Assessment and Plan / ED Course  I have reviewed the triage vital signs and the nursing notes.  Pertinent labs & imaging results that were available during my care of the patient were reviewed by me and considered in my medical decision making (see chart for details).     Patient with chest pain shortness of breath. Began this morning. Feels somewhat better after nitroglycerin breathing  treatment. EKG enzymes reassuring. X-ray does not show volume overload. With chest pain feeling like previous anginal pain will admit to internal medicine.  Final Clinical Impressions(s) / ED Diagnoses   Final diagnoses:  Chest pain, unspecified type  Dyspnea, unspecified type  End stage renal disease on dialysis [facility])    New Prescriptions New Prescriptions   No medications on file     Benjiman Core, MD 10/17/16 [phone]
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
## 2016-10-17 NOTE — ED Notes (Signed)
Attempted to call report. Rn to call back.

## 2016-10-18 ENCOUNTER — Other Ambulatory Visit: Payer: Self-pay

## 2016-10-18 DIAGNOSIS — I5032 Chronic diastolic (congestive) heart failure: Secondary | ICD-10-CM | POA: Diagnosis not present

## 2016-10-18 DIAGNOSIS — R079 Chest pain, unspecified: Secondary | ICD-10-CM | POA: Diagnosis not present

## 2016-10-18 DIAGNOSIS — I132 Hypertensive heart and chronic kidney disease with heart failure and with stage 5 chronic kidney disease, or end stage renal disease: Secondary | ICD-10-CM | POA: Diagnosis not present

## 2016-10-18 DIAGNOSIS — J449 Chronic obstructive pulmonary disease, unspecified: Secondary | ICD-10-CM | POA: Diagnosis not present

## 2016-10-18 DIAGNOSIS — R0602 Shortness of breath: Secondary | ICD-10-CM | POA: Diagnosis not present

## 2016-10-18 DIAGNOSIS — I252 Old myocardial infarction: Secondary | ICD-10-CM | POA: Diagnosis not present

## 2016-10-18 DIAGNOSIS — I2699 Other pulmonary embolism without acute cor pulmonale: Secondary | ICD-10-CM | POA: Diagnosis not present

## 2016-10-18 LAB — HEMOGLOBIN A1C
HEMOGLOBIN A1C: 5 % (ref 4.8–5.6)
MEAN PLASMA GLUCOSE: 97 mg/dL

## 2016-10-18 LAB — BASIC METABOLIC PANEL
Anion gap: 13 (ref 5–15)
BUN: 30 mg/dL — AB (ref 6–20)
CHLORIDE: 97 mmol/L — AB (ref 101–111)
CO2: 26 mmol/L (ref 22–32)
CREATININE: 5.41 mg/dL — AB (ref 0.44–1.00)
Calcium: 8.3 mg/dL — ABNORMAL LOW (ref 8.9–10.3)
GFR calc non Af Amer: 8 mL/min — ABNORMAL LOW (ref >60)
GFR, EST AFRICAN AMERICAN: 9 mL/min — AB (ref >60)
GLUCOSE: 107 mg/dL — AB (ref 65–99)
Potassium: 4.3 mmol/L (ref 3.5–5.1)
Sodium: 136 mmol/L (ref 135–145)

## 2016-10-18 LAB — LIPID PANEL
CHOL/HDL RATIO: 2.8 ratio
Cholesterol: 163 mg/dL (ref 0–200)
HDL: 58 mg/dL (ref >40)
LDL Cholesterol: 82 mg/dL (ref 0–99)
Triglycerides: 114 mg/dL (ref <150)
VLDL: 23 mg/dL (ref 0–40)

## 2016-10-18 LAB — CBC
HCT: 30 % — ABNORMAL LOW (ref 36.0–46.0)
Hemoglobin: 10 g/dL — ABNORMAL LOW (ref 12.0–15.0)
MCH: 32.6 pg (ref 26.0–34.0)
MCHC: 33.3 g/dL (ref 30.0–36.0)
MCV: 97.7 fL (ref 78.0–100.0)
PLATELETS: 203 10*3/uL (ref 150–400)
RBC: 3.07 MIL/uL — AB (ref 3.87–5.11)
RDW: 15.2 % (ref 11.5–15.5)
WBC: 8.9 10*3/uL (ref 4.0–10.5)

## 2016-10-18 LAB — TROPONIN I: Troponin I: <0.03 ng/mL (ref <0.03)

## 2016-10-18 LAB — HIV ANTIBODY (ROUTINE TESTING W REFLEX): HIV Screen 4th Generation wRfx: NONREACTIVE

## 2016-10-18 MED ORDER — ASPIRIN EC 81 MG PO TBEC: 81.0000 mg | DELAYED_RELEASE_TABLET | Freq: Every day | ORAL | 1 refills | Status: AC

## 2016-10-18 MED ORDER — ALBUTEROL SULFATE HFA 108 (90 BASE) MCG/ACT IN AERS: 2.0000 | INHALATION_SPRAY | Freq: Four times a day (QID) | RESPIRATORY_TRACT | 2 refills | Status: AC | PRN

## 2016-10-18 NOTE — Care Management Note (Signed)
Case Management Note  Patient Details  Name: Nancy Macdonald MRN: [id number] Date of Birth: 10-19-1950  Subjective/Objective:                 Spoke with patient at the bedside. Patient has order to DC to home today. No CM consult, order, or needs, identified at this time.    Action/Plan:  DC to home self care.   Expected Discharge Date:  10/18/16               Expected Discharge Plan:  Home/Self Care  In-House Referral:     Discharge planning Services  CM Consult  Post Acute Care Choice:    Choice offered to:     DME Arranged:    DME Agency:     HH Arranged:    HH Agency:     Status of Service:  Completed, signed off  If discussed at [employer] of Stay Meetings, dates discussed:    Additional Comments:  Lawerance Sabal, RN 10/18/2016, 11:19 AM

## 2016-10-18 NOTE — Care Management Obs Status (Signed)
MEDICARE OBSERVATION STATUS NOTIFICATION   Patient Details  Name: Nancy Macdonald MRN: [id number] Date of Birth: 07/10/1950   Medicare Observation Status Notification Given:  Yes    Lawerance Sabal, RN 10/18/2016, 11:10 AM

## 2016-10-18 NOTE — Discharge Instructions (Signed)
Nancy Macdonald,  We did not find evidence of a heart attack or blood clot to explain your shortness of breath. Your pain may be related to anxiety or your lung disease, but we do recommend following up with your primary care physician and cardiologist for further work up for your heart and lungs. Take all of your medications as prescribed. Take aspirin 81 mg daily. Take albuterol inhaler every 6 hours as needed.

## 2016-10-18 NOTE — Progress Notes (Signed)
Subjective: Patient was seen and examined this morning. Patient feels well, she denies chest pain, pleuritic chest pain or chest pressure. He continues to have her chronic unchanged shortness of breath.  Objective: Vital signs in last 24 hours: Vitals:   10/17/16 1845 10/17/16 2013 10/18/16 0504 10/18/16 0931  BP: (!) 161/99 (!) 184/87 122/65 95/61  Pulse:  (!) 109 93   Resp:  18 18   Temp:  98.5 F (36.9 C) 98.6 F (37 C)   TempSrc:  Oral Oral   SpO2: 98% 96% 93%   Weight:   125 lb 14.4 oz (57.1 kg)   Height:       Physical Exam General: Vital signs reviewed.  Patient is well-developed and well-nourished, in no acute distress and cooperative with exam.  Cardiovascular: RRR, S1 normal, S2 normal, no murmurs, gallops, or rubs. Pulmonary/Chest: Clear to auscultation bilaterally, no wheezes, rales, or rhonchi. Abdominal: Soft, non-tender, non-distended, BS + Extremities: No lower extremity edema bilaterally Neurological: Awake and alert, moving all extremities. Answers all questions appropriately.   Assessment/Plan: Principal Problem:   Chest pain Active Problems:   Chronic diastolic CHF (congestive heart failure) (HCC)   ESRD (end stage renal disease) on dialysis [facility])   Essential hypertension   Anemia of chronic renal failure   COPD (chronic obstructive pulmonary disease) (HCC)   Renal osteodystrophy  Nancy Macdonald is a 56 F with PMHx of ESRD on HD TTS, HTN, COPD presenting with chest pain and shortness of breath.   Atypical Pleuritic Chest Pain: Patient developed acute onset of worsened shortness of breath and pleuritic chest pressure located in the left parasternal region the morning of 4/26 upon ambulating back to bed which resolved within seconds. Pain has resolved. EKG has been without ischemic changes on both. Troponins negative x 3. Given concern for pulmonary embolism, d-dimer was checked. D-dimer was elevated; therefore, CT angiogram chest was checked and negative for  pulmonary embolism. The CT angiogram did show underlying coronary artery disease, cardiomegaly and small airway disease. Acute coronary syndrome has been ruled out and her presentation of chest pain was very atypical. The cause of her symptoms from yesterday morning are likely secondary to anxiety, or COPD. Patient feels well this mor check, ning it is safe to be discharged home with follow-up with her cardiologist and primary care physician. Patient's ASCVD risk score is 4.7%, will not start statin at this time. Hemoglobin A1c 5.0. -Discharged to home with close follow-up with cardiologist and primary care physician -Prescribe aspirin 81 mg daily at discharge -Continue blood pressure medications  COPD: Patient has a self-reported history of COPD, but states she has never undergone PFT evaluation. Her COPD is uncontrolled at baseline. She has chronic dyspnea on exertion and chronic dyspnea at rest if she is talking. Patient does not take any inhalers at home. She is a former smoker, 30-pack-year history, but quit one year ago.  -Outpatient PFTs -Discharge with Albuterol inhaler Q6H prn   ESRD: On HD TTS. Patient went for dialysis yesterday.  -Continue dialysis TTS  Hypertension: Patient has a history of uncontrolled hypertension leading to end-stage renal disease. Patient is on amlodipine 5 mg daily at home and Coreg 25 mg twice a day. Normotensive. -Continue home medications  Chronic HFpEF: Echocardiogram from November 2017 showed grade 1 diastolic dysfunction but no regional wall motion abnormalities. Ejection fraction normal. Euvolemic.  DVT/PE PPX: Heparin Code: Full FEN: Renal  Dispo: Anticipated discharge in approximately 0 day(s).   LOS: 0 days  Karlene Lineman, DO PGY-3 Internal Medicine Resident Pager # [phone] 10/18/2016 11:50 AM

## 2016-10-18 NOTE — Progress Notes (Signed)
CSW was consulted by RN to assist  patient with transportation back home. Patient stated her daughter was suppose to come pick her up but is unable to because she does not drive. CSW contacted [facility] for pickup. CSW signing off as patient has no more social work needs.  Marrianne Mood, MSW,  [employer] [phone]

## 2016-10-18 NOTE — Progress Notes (Signed)
Internal Medicine Attending:   I saw and examined the patient. I reviewed the resident's note and I agree with the resident's findings and plan as documented in the resident's note. Chest pain has resolved, reports she is back to her normal state of health.  Her CE remained negative, her chest pain was very atypical but she does have evidence of coronary clacifications on her CT.  We discussed with we would recommend her to continue Asprin  daily and follow up with her cardiologist to consider stress testing.  We also stressed the importance of regular PCP visits for her multiple medical problems.

## 2016-10-18 NOTE — Discharge Summary (Signed)
Name: Nancy Macdonald MRN: [id number] DOB: 09-Mar-1951 66 y.o. PCP: Pcp Not In System  Date of Admission: 10/17/2016  6:37 AM Date of Discharge: 10/18/2016 Attending Physician: Gust Rung, DO  Discharge Diagnosis: Principal Problem:   Chest pain Active Problems:   Chronic diastolic CHF (congestive heart failure) (HCC)   ESRD (end stage renal disease) on dialysis [facility])   Essential hypertension   Anemia of chronic renal failure   COPD (chronic obstructive pulmonary disease) (HCC)   Renal osteodystrophy   Discharge Medications: Allergies as of 10/18/2016   No Known Allergies     Medication List    STOP taking these medications   oxyCODONE-acetaminophen 5-325 MG tablet Commonly known as:  PERCOCET/ROXICET     TAKE these medications   albuterol 108 (90 Base) MCG/ACT inhaler Commonly known as:  PROVENTIL HFA;VENTOLIN HFA Inhale 2 puffs into the lungs every 6 (six) hours as needed for wheezing or shortness of breath.   amLODipine 5 MG tablet Commonly known as:  NORVASC Take 5 mg by mouth at bedtime.   aspirin EC 81 MG tablet Take 1 tablet (81 mg total) by mouth daily.   carvedilol 25 MG tablet Commonly known as:  COREG Take 25 mg by mouth 2 (two) times daily.   sevelamer carbonate 800 MG tablet Commonly known as:  RENVELA Take 1,600 mg by mouth 3 (three) times daily with meals.       Disposition and follow-up:   NancyMaimuna L Smart was discharged from [facility] in Good condition.  At the hospital follow up visit please address:  1.  Chest Pain: Please address recurrence of chest pain and consider outpatient stress test.  COPD: This is a self reported diagnosis. Patient has a 30-pack-year history of smoking and dyspnea on exertion which is chronic and unchanged. Please consider pulmonary function testing to further evaluate and adjust medications accordingly. Patient was discharged on albuterol when necessary.  2.  Labs / imaging needed at  time of follow-up: PFTs, consider outpatient stress test  3.  Pending labs/ test needing follow-up: None  Follow-up Appointments: Follow-up Information    [facility], MD. Schedule an appointment as soon as possible for a visit.   Specialty:  Cardiology Why:  for hospital follow up Contact information: [address] [place] [address] [phone]           Hospital Course by problem list: Principal Problem:   Chest pain Active Problems:   Chronic diastolic CHF (congestive heart failure) (HCC)   ESRD (end stage renal disease) on dialysis [facility])   Essential hypertension   Anemia of chronic renal failure   COPD (chronic obstructive pulmonary disease) (HCC)   Renal osteodystrophy   Nancy Macdonald is a 55 F with PMHx of ESRD on HD TTS, HTN, COPD presenting with chest pain and shortness of breath.   Pleuritic Chest Pressure: Patient developed acute onset of worsened shortness of breath and pleuritic chest pressure located in the left parasternal region upon ambulating back to bed. This resolved within seconds, however patient continued to have pleuritic pain with deep inspiration. Upon review of the chart, it does appear that patient has had chest pain in the left parasternal location in the past describes similarly as fleeting and unprovoked. Patient was afebrile, normal respiratory rate, satting well on room air. Chest x-ray was without acute cardiopulmonary disease. Troponins negative x 3. EKG without ischemic changes x 2. Recent echocardiogram in November 2017 showed no regional wall  motion abnormalities and a normal ejection fraction.Heart score of 3, low risk. Well's score for PE is 0, d-dimer >2. CT angiogram chest was checked and negative for pulmonary embolism. The CT angiogram did show underlying coronary artery disease, cardiomegaly and small airway disease. Acute coronary syndrome has been ruled out and her presentation of chest pain was very atypical. The cause  of her symptoms are likely secondary to anxiety, or COPD. Patient's ASCVD risk score is 4.7%, will not start statin at this time. Hemoglobin A1c 5.0.  COPD: Patient has a self-reported history of COPD, but states she has never undergone PFT evaluation. Her COPD is uncontrolled at baseline. She has chronic dyspnea on exertion and chronic dyspnea at rest if she is talking. Patient does not take any inhalers at home. She is a former smoker, 30-pack-year history, but quit one year ago. Needs oupatient PFTs.   ESRD:  On HD TTS.   Hypertension: Patient has a history of uncontrolled hypertension leading to end-stage renal disease. Patient is on amlodipine 5 mg daily at home and Coreg 25 mg twice a day. Normotensive during admission.  Chronic HFpEF: Echocardiogram from November 2017 showed grade 1 diastolic dysfunction but no regional wall motion abnormalities. Ejection fraction normal. Patient appears euvolemic on examination. She denies weight gain, lower extremity edema or orthopnea. Stable.   Discharge Vitals:   BP 95/61   Pulse 93   Temp 98.6 F (37 C) (Oral)   Resp 18   Ht  (1.626 m)   Wt 125 lb 14.4 oz (57.1 kg)   SpO2 93%   BMI 21.61 kg/m   Pertinent Labs, Studies, and Procedures:  CBC    Component Value Date/Time   WBC 8.9 10/18/2016 0223   RBC 3.07 (L) 10/18/2016 0223   HGB 10.0 (L) 10/18/2016 0223   HCT 30.0 (L) 10/18/2016 0223   PLT 203 10/18/2016 0223   MCV 97.7 10/18/2016 0223   MCH 32.6 10/18/2016 0223   MCHC 33.3 10/18/2016 0223   RDW 15.2 10/18/2016 0223   LYMPHSABS 1.7 10/17/2016 0728   MONOABS 0.9 10/17/2016 0728   EOSABS 0.2 10/17/2016 0728   BASOSABS 0.0 10/17/2016 0728   BMET    Component Value Date/Time   NA 136 10/18/2016 0223   K 4.3 10/18/2016 0223   CL 97 (L) 10/18/2016 0223   CO2 26 10/18/2016 0223   GLUCOSE 107 (H) 10/18/2016 0223   BUN 30 (H) 10/18/2016 0223   CREATININE 5.41 (H) 10/18/2016 0223   CALCIUM 8.3 (L) 10/18/2016 0223    GFRNONAA 8 (L) 10/18/2016 0223   GFRAA 9 (L) 10/18/2016 0223   Troponin (Point of Care Test)  Recent Labs  10/17/16 0740  TROPIPOC 0.00    Signed: Karlene Lineman, DO PGY-3 Internal Medicine Resident Pager # [phone] 10/18/2016 11:56 AM

## 2016-11-01 ENCOUNTER — Encounter: Payer: Medicare Other | Admitting: Vascular Surgery

## 2016-11-01 ENCOUNTER — Encounter (HOSPITAL_COMMUNITY): Payer: Medicare Other

## 2016-11-04 ENCOUNTER — Encounter: Payer: Self-pay | Admitting: Vascular Surgery

## 2016-11-08 ENCOUNTER — Encounter: Payer: Medicare Other | Admitting: Vascular Surgery

## 2017-12-09 IMAGING — DX DG CHEST 2V
2 series · 2 of 2 positions shown · non-contrast
Comparison: None.

CLINICAL DATA: Pt c/o generalized chest pain and productive cough x
2 days. Hx of CHF, CAD, COPD, ESRD, HTN, AND PNA. Pt is a current
smoker.

EXAM:
CHEST  2 VIEW

[w chest pa]
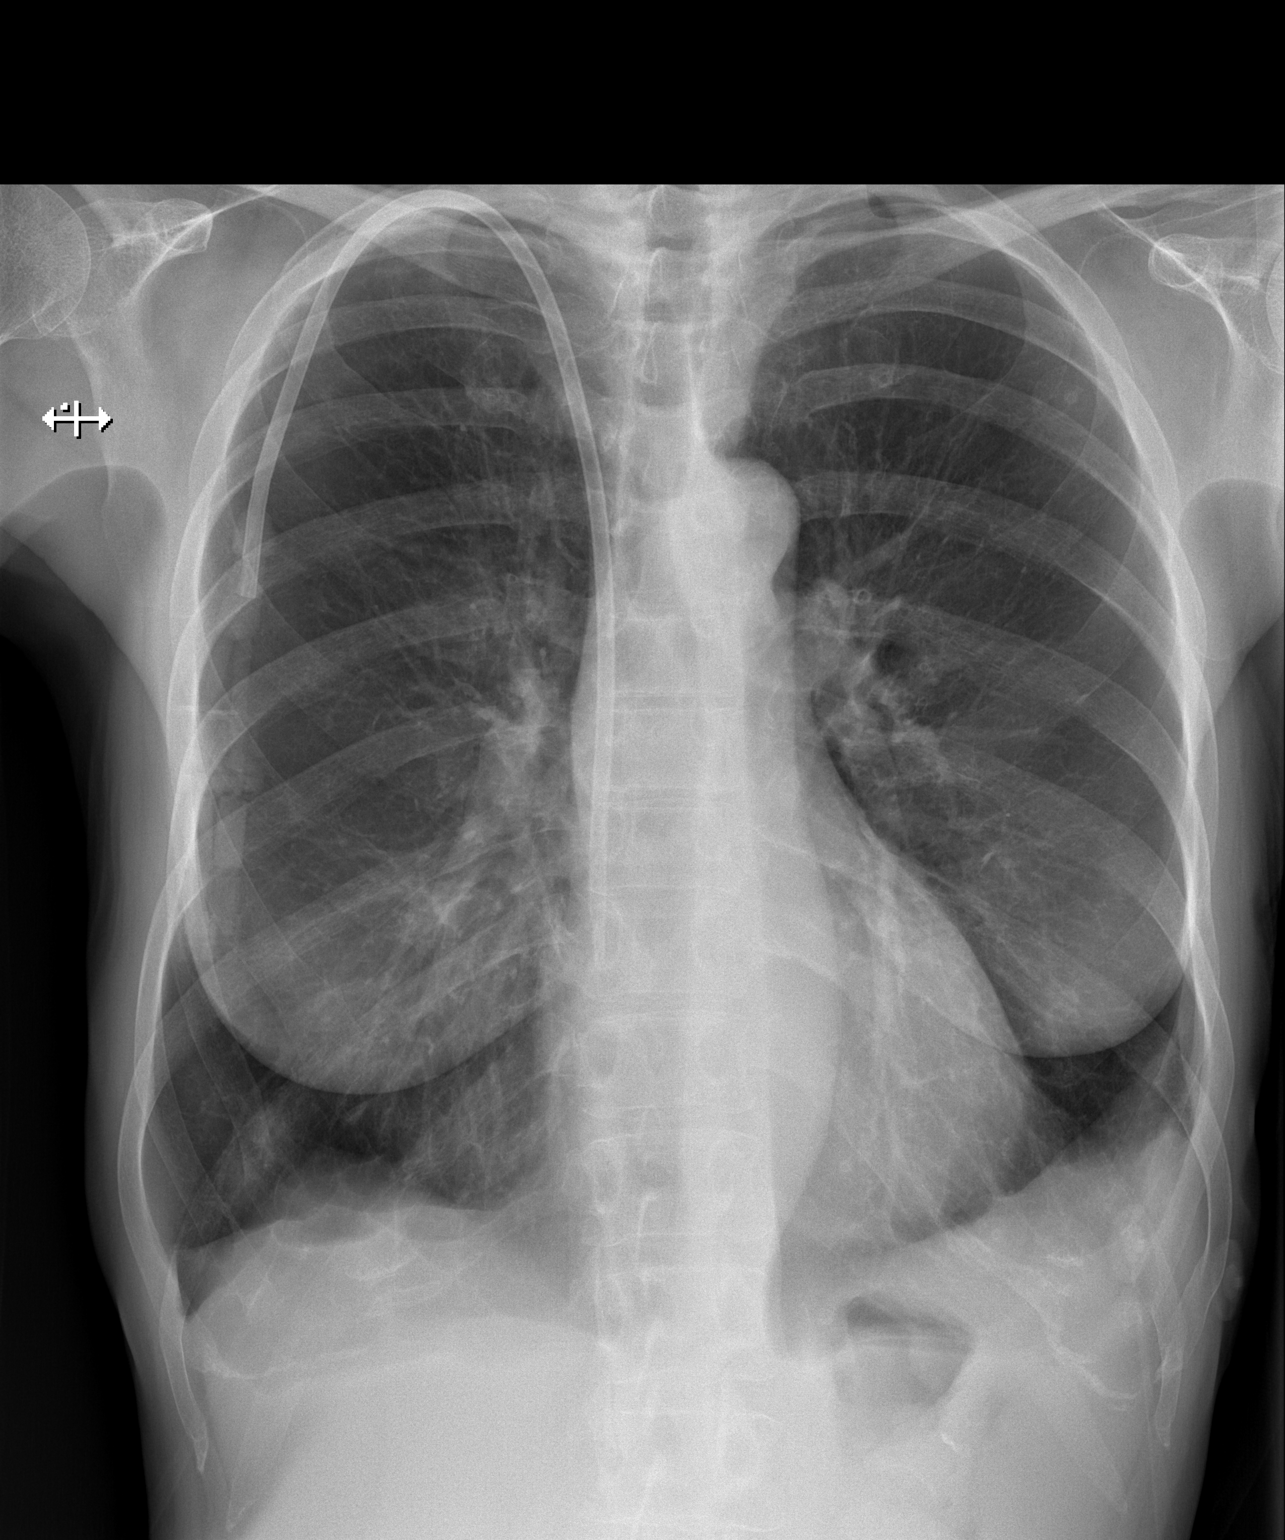

[w chest lat]
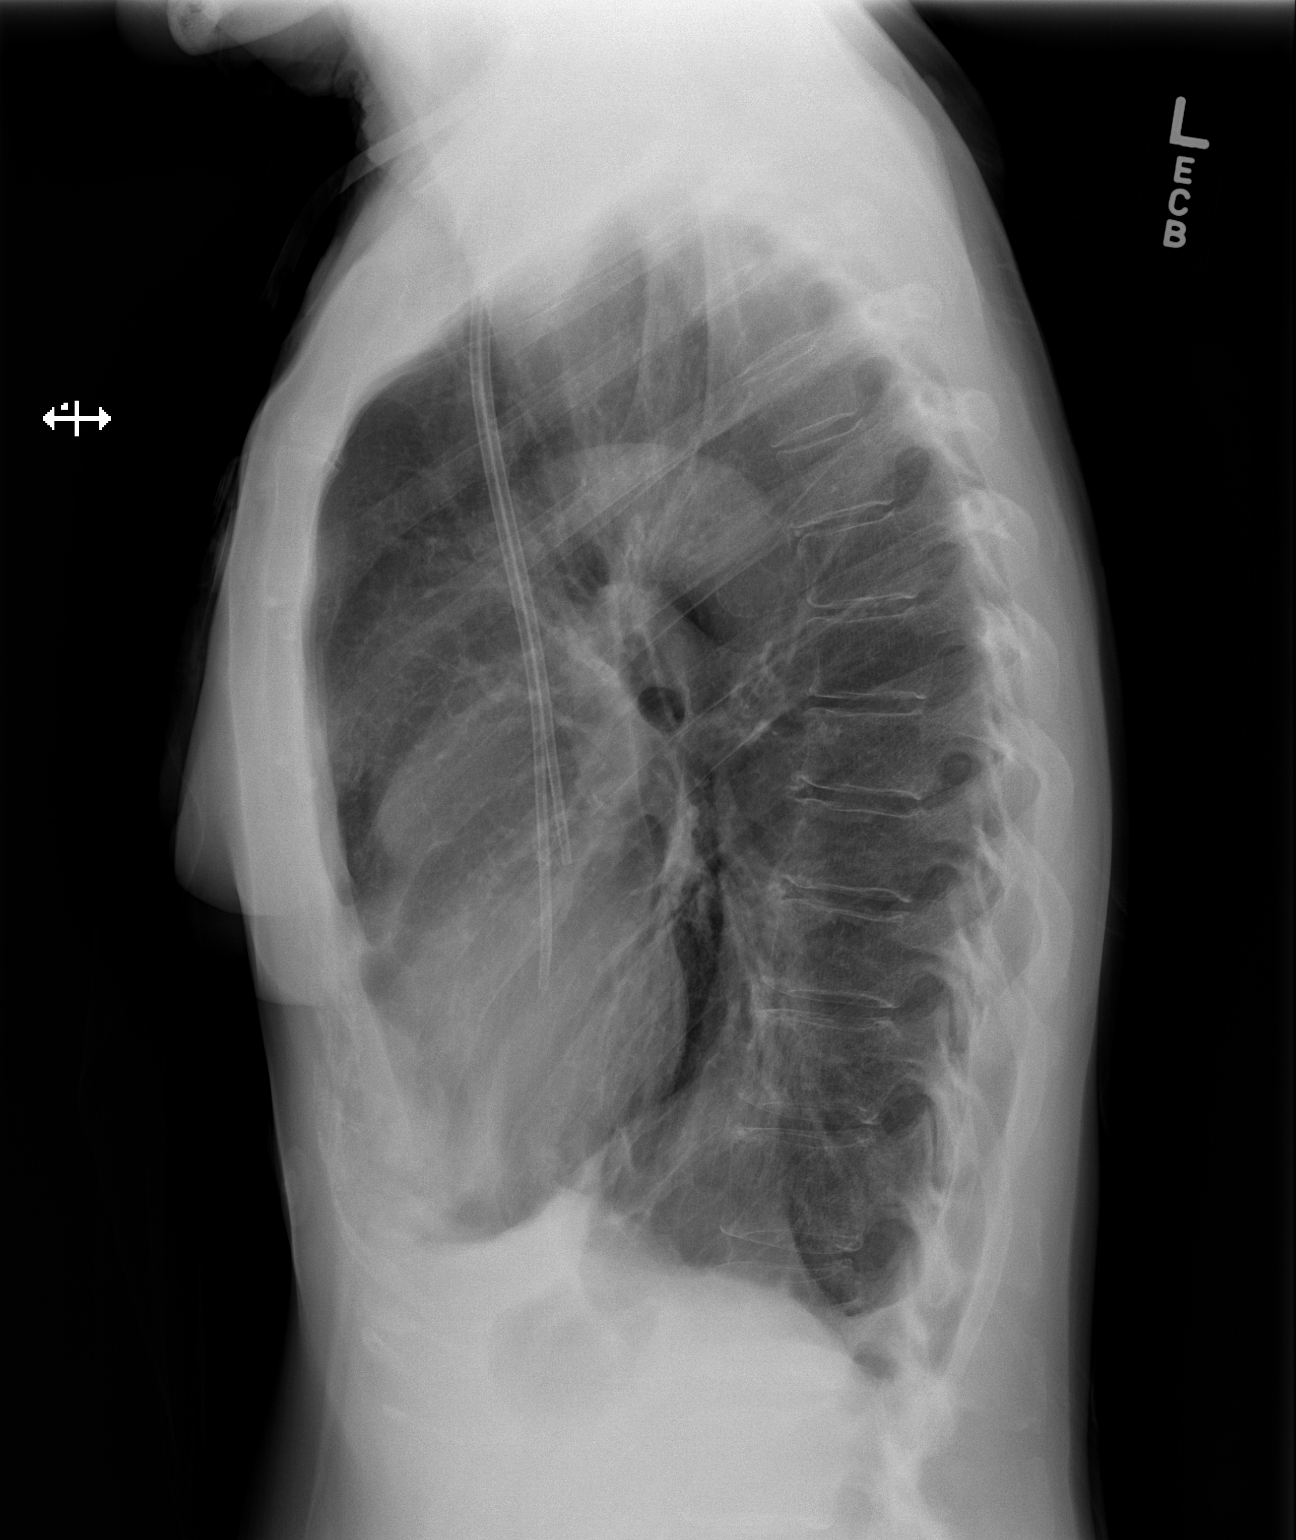

[2 of 2 positions shown; findings below may reference images not displayed]

FINDINGS: Right-sided dual-lumen central venous line tip overlies the level of
the lower superior vena cava. Lungs are hyperinflated. Heart size is
normal. There are bilateral pleural effusions. Bibasilar opacities
are consistent with infiltrates and/or atelectasis.
IMPRESSION: 1. Hyperinflated lungs.
2. Bibasilar atelectasis or infiltrates and bilateral pleural
effusions.

## 2018-07-25 DEATH — deceased
